# Patient Record
Sex: Male | Born: 2000 | Race: White | Hispanic: No | Marital: Single | Smoking: Never smoker
Health system: Southern US, Community
[De-identification: ages and names within clinical notes are randomized; demographics above are authoritative.]

## PROBLEM LIST (undated history)

## (undated) DIAGNOSIS — E109 Type 1 diabetes mellitus without complications: Secondary | ICD-10-CM

## (undated) DIAGNOSIS — E063 Autoimmune thyroiditis: Secondary | ICD-10-CM

## (undated) DIAGNOSIS — K900 Celiac disease: Secondary | ICD-10-CM

## (undated) HISTORY — DX: Autoimmune thyroiditis: E06.3

## (undated) HISTORY — PX: NO PAST SURGERIES: SHX2092

---
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
## 2019-04-07 ENCOUNTER — Other Ambulatory Visit (INDEPENDENT_AMBULATORY_CARE_PROVIDER_SITE_OTHER): Payer: Self-pay | Admitting: Pediatric Endocrinology

## 2019-04-07 DIAGNOSIS — E1065 Type 1 diabetes mellitus with hyperglycemia: Principal | ICD-10-CM

## 2019-04-07 DIAGNOSIS — IMO0001 Reserved for inherently not codable concepts without codable children: Secondary | ICD-10-CM

## 2019-04-24 ENCOUNTER — Other Ambulatory Visit (INDEPENDENT_AMBULATORY_CARE_PROVIDER_SITE_OTHER): Payer: Self-pay | Admitting: *Deleted

## 2019-04-24 ENCOUNTER — Telehealth (INDEPENDENT_AMBULATORY_CARE_PROVIDER_SITE_OTHER): Payer: Self-pay | Admitting: Pediatric Endocrinology

## 2019-04-24 NOTE — Telephone Encounter (Signed)
°  Who's calling (name and relationship to patient) : [name] (Mother)  Best contact number: [phone] Provider they see: [name]  Reason for call: Mom suspects that pt may have COVID. Mom stated that nurse at his former PCP advised that if pt's fever returns, if he becomes dizzy, or if he has blood in his stool, he should go to the emergency room. Mom wanted to know if [name] agreed with these recommendations given pt's present underlying conditions.

## 2019-04-24 NOTE — Telephone Encounter (Signed)
Returned call to mom.   Stephen Thompson was sleeping.   He has had symptoms for about a week. He was tested a few days ago in [facility] for Covid. He tested negative.   2 nights ago he had fever and some bright red blood in his stool. They returned to the testing center in [facility] where he had labs drawn.   Mom reports that his WBC was low and his platelet count was low (139 with nml >150).   He has been having intermittent fevers, trouble sleeping, aches, chills, and the 1 episode of rectal bleeding. He has not been having shortness of breath or significant respiratory symptoms.   She was concerned with his history of T1DM if she needed to take him the hospital? Discussed that he does need to stay on top of his sugar and make sure that he is taking insulin so that he does not go into ketosis as that would be a very bad combination with presumed Covid 19 infection. Discussed that Covid 19 can take 2-3 weeks and it is a very bumpy road. Advised her to keep in contact with his PCP. If she sees evidence of hemorrhage or bruising in his toes/fingers or increase in rectal bleeding or shortness of breath or feeling light headed she should take him to ER. Discussed that at 82 I cannot get him into the pediatric ED or admitted to the pediatric service.  Mom appreciated my return call and said that she will keep in touch with his PCP and keep [place] updated.   Lelon Huh, MD

## 2019-04-27 NOTE — Telephone Encounter (Signed)
Erroneous encounter

## 2019-05-27 ENCOUNTER — Encounter (INDEPENDENT_AMBULATORY_CARE_PROVIDER_SITE_OTHER): Payer: Self-pay | Admitting: Pediatric Endocrinology

## 2019-05-27 ENCOUNTER — Ambulatory Visit (INDEPENDENT_AMBULATORY_CARE_PROVIDER_SITE_OTHER): Payer: BC Managed Care – PPO | Admitting: Pediatric Endocrinology

## 2019-05-27 ENCOUNTER — Other Ambulatory Visit: Payer: Self-pay

## 2019-05-27 VITALS — BP 122/74 | HR 78 | Ht 67.6 in | Wt 183.6 lb

## 2019-05-27 DIAGNOSIS — E1065 Type 1 diabetes mellitus with hyperglycemia: Secondary | ICD-10-CM | POA: Diagnosis not present

## 2019-05-27 DIAGNOSIS — K900 Celiac disease: Secondary | ICD-10-CM | POA: Diagnosis not present

## 2019-05-27 DIAGNOSIS — IMO0001 Reserved for inherently not codable concepts without codable children: Secondary | ICD-10-CM

## 2019-05-27 DIAGNOSIS — Z4681 Encounter for fitting and adjustment of insulin pump: Secondary | ICD-10-CM | POA: Diagnosis not present

## 2019-05-27 LAB — POCT GLUCOSE (DEVICE FOR HOME USE): POC Glucose: 192 mg/dl — AB (ref 70–99)

## 2019-05-27 LAB — POCT GLYCOSYLATED HEMOGLOBIN (HGB A1C): Hemoglobin A1C: 9 % — AB (ref 4.0–5.6)

## 2019-05-27 NOTE — Progress Notes (Signed)
Subjective:  Subjective  Patient Name: Stephen Thompson Date of Birth: Mar 25, 2001  MRN: [id number]  Stephen Thompson  presents to the office today for follow-up evaluation and management of his type 1 diabetes, hypoglycemia, goiter, and celiac disease.    HISTORY OF PRESENT ILLNESS:   Stephen Thompson is a 18 y.o. Caucasian male   Stephen Thompson was accompanied by himself His mother came in at the start to ask her Covid related questions.   74. [name] was admitted to the PICU at [facility] on 10/31/11 for evaluation and management of new-onset T1DM, DKA, dehydration, and weight loss. After being successfully treated with iv insulin, he was transferred to the Pediatric Ward. He was transitioned to MDI with Novolog and Lantus. His most recent Lantus dose was 8 units. He was on the Novolog 150/50/30 two-component plan.  On 08/26/12 he was converted to a Medtronic Paradigm 723 insulin pump.     2. The patient's last [facility] visit was on 12/11/18. In the interim, he has been doing well.    He did have an episode of illness about 1 month ago associated with high fever and stomach issues. He tested negative for Covid. He returned to the testing center a day or 2 later and had blood work which showed a low white count and a low platelet count. He was presumed positive for Covid based on his symptoms and his blood counts. He felt better a few days later. Mom was asymptomatic but was quarantined for 3 weeks by her employer.   He feels that his sugar has been overall high. His sleep schedule is highly variable. He is eating about 2 meals per day and trying to stick to low carb snacks between meals.   He is usually bolusing for his food (there is a day with no food boluses on his printout). He often neglects to enter in his blood sugar in his pump. He is taking his insulin AFTER eating. He sees that his sugar spikes on his Dexcom.   He has been active recently painting a house for his GF's father. He is  learning coding and working with his Arts development officer.   He and his mom have mixed feelings about retuning to campus in the fall. He is at [employer] and is not doing the adjusted schedule of the other campuses which are starting earlier and ending by Thanksgiving.  They need to decide by July if he will be returning to campus this fall.   He did not enjoy having online classes this spring.   Celiac  He is eating gluten free all of the time. He is symptomatic if he accidentally gets gluten.     3. Pertinent Review of Systems:  Constitutional: The patient feels "tired". The patient seems healthy and active. Eyes: Vision seems to be good. There are no recognized eye problems. Eye exam in March 2016. Due 2021 Feels that his vision is worse.  Neck: The patient has no complaints of anterior neck swelling, soreness, tenderness, pressure, discomfort, or difficulty swallowing.   Heart: Heart rate increases with exercise or other physical activity. The patient has no complaints of palpitations, irregular heart beats, chest pain, or chest pressure.   Lungs: no asthma or wheezing.  Gastrointestinal: Bowel movents seem normal. The patient has no complaints of excessive hunger, acid reflux, upset stomach, stomach aches or pains, diarrhea, or constipation.  Legs: Muscle mass and strength seem normal. There are no complaints of numbness, tingling, burning, or pain. No edema is  noted.  Feet: There are no obvious foot problems. There are no complaints of numbness, tingling, burning, or pain. No edema is noted. Neurologic: There are no recognized problems with muscle movement and strength, sensation, or coordination. GYN/GU: normal   Diabetes ID: MY ID bracelet.    Annual labs last done July 2019- no issues- will do today  Pump download: avg glucose 310 +/- 81. 45% basal. 124 grams of carb per day. 99% above target.   Last visit: Testing 3.6 times per day + Dexcom Avg BG 293 +/- 89. 89% above target. 45% basal. 146  grams of carb per day.              CGM/Dexcom avg SG 222 +/- 66. 73% above target, 26% in target, 1% below target. In target mostly when sleeping.   Last visit:  avg SG 232 +/- 88. 66% above target. 34% in target. Rare hypoglycemia. Pattern of night time and late afternoon hyperglycemia. Change in schedule with college.   PAST MEDICAL, FAMILY, AND SOCIAL HISTORY  Past Medical History:  Diagnosis Date  . Celiac disease 02/24/2012   Recently diagnosed  . Diabetes mellitus   . Type 1 diabetes mellitus not at goal [facility]) 10/31/2011    Family History  Problem Relation Age of Onset  . Diabetes Paternal Grandfather        type 2  . Hyperthyroidism Maternal Grandmother   . Thyroid disease Maternal Grandmother   . Multiple sclerosis Maternal Grandfather   . Diabetes Maternal Grandfather        type 2  . Heart failure Cousin   . Celiac disease Neg Hx      Current Outpatient Medications:  .  ACCU-CHEK FASTCLIX LANCETS MISC, 1 Container by Does not apply route See admin instructions. Check blood sugar 10 times daily Dispense 300 lancets per month., Disp: 300 each, Rfl: 6 .  Continuous Blood Gluc Sensor (DEXCOM G6 SENSOR) MISC, 1 each by Does not apply route as directed. 1 sensor every 10 days, Disp: 3 each, Rfl: 5 .  Continuous Blood Gluc Transmit (DEXCOM G6 TRANSMITTER) MISC, 1 each by Does not apply route every 3 (three) months., Disp: 1 each, Rfl: 1 .  glucagon (GLUCAGEN HYPOKIT) 1 MG SOLR injection, GLUCAGEN HYPOKIT DOUBLE PACK. Inject 1 mg into anterior thigh muscle if unconscious, unable to swallow, unresponsive and/or has a seizure, Disp: 2 each, Rfl: 1 .  insulin aspart (NOVOLOG) 100 UNIT/ML injection, USE 300 UNITS IN INSULIN PUMP EVERY 48 HOURS AS DIRECTED, Disp: 40 mL, Rfl: 1 .  Glucagon (BAQSIMI TWO PACK) 3 MG/DOSE POWD, Place 1 each into the nose as needed (severe hypoglycemia with unresponsiveness). (Patient not taking: Reported on 05/27/2019), Disp: 1 each, Rfl: 3 .  glucose  blood (ACCU-CHEK GUIDE) test strip, Use as instructed for 6 checks per day plus per protocol for hyper/hypoglycemia (Patient not taking: Reported on 05/27/2019), Disp: 200 each, Rfl: 3 .  insulin aspart (NOVOLOG FLEXPEN) 100 UNIT/ML FlexPen, Inject into skin if insulin pump fails, per diabetes plan (Patient not taking: Reported on 12/25/2016), Disp: 5 mL, Rfl: 3 .  insulin glargine (LANTUS) 100 UNIT/ML injection, Use as directed by physician if insulin pump fails. (Patient not taking: Reported on 12/25/2016), Disp: 5 mL, Rfl: 3 .  Insulin Pen Needle 32G X 4 MM MISC, Inject 1 each into the skin as needed. Reported on 05/08/2016, Disp: , Rfl:   Allergies as of 05/27/2019 - Review Complete 05/27/2019  Allergen Reaction Noted  .  Wheat  12/20/2011     reports that he is a non-smoker but has been exposed to tobacco smoke. He has never used smokeless tobacco. He reports that he does not drink alcohol or use drugs. Pediatric History  Patient Parents  . Dromgoole,Alexandra (Mother)   Other Topics Concern  . Not on file  Social History Narrative   Mom has primary custody. 8th grade. Soccer  Parents have joint custody per mother 12/20/2011. Dad in rehab as of fall 2014    Primary Care Provider: Orpha Bur, DO   Rising Sophomore at [employer]: There are no other significant problems involving Marilyn's other body systems.    Objective:  Objective   Vital Signs:  BP 122/74   Pulse 78   Ht 5' 7.6" (1.717 m)   Wt 183 lb 9.6 oz (83.3 kg)   BMI 28.25 kg/m    Ht Readings from Last 3 Encounters:  05/27/19 5' 7.6" (1.717 m) (24 %, Z= -0.70)*  12/11/18 5' 8"  (1.727 m) (30 %, Z= -0.54)*  07/07/18 5' 7.91" (1.725 m) (29 %, Z= -0.54)*   * Growth percentiles are based on CDC (Boys, 2-20 Years) data.   Wt Readings from Last 3 Encounters:  05/27/19 183 lb 9.6 oz (83.3 kg) (85 %, Z= 1.02)*  12/11/18 165 lb 8 oz (75.1 kg) (70 %, Z= 0.51)*  07/07/18 167 lb 6.4 oz (75.9 kg) (74 %, Z= 0.64)*    * Growth percentiles are based on CDC (Boys, 2-20 Years) data.   HC Readings from Last 3 Encounters:  No data found for [facility]   Body surface area is 1.99 meters squared. 24 %ile (Z= -0.70) based on CDC (Boys, 2-20 Years) Stature-for-age data based on Stature recorded on 05/27/2019. 85 %ile (Z= 1.02) based on CDC (Boys, 2-20 Years) weight-for-age data using vitals from 05/27/2019.    PHYSICAL EXAM:   Constitutional: The patient appears active and engaged.  The patient's height and weight are normal for age. He has had some weight gain Head: The head is normocephalic. Face: The face appears normal. There are no obvious dysmorphic features. Eyes: The eyes appear to be normally formed and spaced. Gaze is conjugate. There is no obvious arcus or proptosis. Moisture appears normal.  Ears: The ears are normally placed and appear externally normal. Mouth: The oropharynx and tongue appear normal. Dentition appears to be normal for age. Oral moisture is normal.  Neck: The neck appears to be visibly normal. The thyroid gland is 14 grams in size. The consistency of the thyroid gland is normal. The thyroid gland is not tender to palpation. Lungs: The lungs are clear to auscultation. Air movement is good. Heart: Heart rate and rhythm are regular. Heart sounds S1 and S2 are normal. I did not appreciate any pathologic cardiac murmurs. Abdomen: The abdomen appears to be normal in size for the patient's age. Bowel sounds are normal. There is no obvious hepatomegaly, splenomegaly, or other mass effect. Arms: Muscle size and bulk are normal for age. Hands: There is no obvious tremor. Phalangeal and metacarpophalangeal joints are normal. Palmar muscles are normal for age. Palmar skin is normal. Palmar moisture is also normal. Legs: Muscles appear normal for age. No edema is present. Feet: Feet are normally formed. Dorsalis pedal pulses are normal. Neurologic: Strength is normal for age in both the upper and lower  extremities. Muscle tone is normal. Sensation to touch is normal in both the legs and feet.     LAB DATA:  Results for orders placed or performed in visit on 05/27/19 (from the past 672 hour(s))  Comprehensive metabolic panel   Collection Time: 05/27/19 12:00 AM  Result Value Ref Range   Glucose, Bld 120 (H) 65 - 99 mg/dL   BUN 14 7 - 20 mg/dL   Creat 0.80 0.60 - 1.26 mg/dL   BUN/Creatinine Ratio NOT APPLICABLE 6 - 22 (calc)   Sodium 139 135 - 146 mmol/L   Potassium 3.7 (L) 3.8 - 5.1 mmol/L   Chloride 102 98 - 110 mmol/L   CO2 26 20 - 32 mmol/L   Calcium 9.6 8.9 - 10.4 mg/dL   Total Protein 7.1 6.3 - 8.2 g/dL   Albumin 4.7 3.6 - 5.1 g/dL   Globulin 2.4 2.1 - 3.5 g/dL (calc)   AG Ratio 2.0 1.0 - 2.5 (calc)   Total Bilirubin 0.4 0.2 - 1.1 mg/dL   Alkaline phosphatase (APISO) 107 46 - 169 U/L   AST 26 12 - 32 U/L   ALT 24 8 - 46 U/L  CBC with Differential/Platelet   Collection Time: 05/27/19 12:00 AM  Result Value Ref Range   WBC 6.1 3.8 - 10.8 Thousand/uL   RBC 5.36 4.20 - 5.80 Million/uL   Hemoglobin 15.0 13.2 - 17.1 g/dL   HCT 45.6 38.5 - 50.0 %   MCV 85.1 80.0 - 100.0 fL   MCH 28.0 27.0 - 33.0 pg   MCHC 32.9 32.0 - 36.0 g/dL   RDW 12.7 11.0 - 15.0 %   Platelets 257 140 - 400 Thousand/uL   MPV 9.9 7.5 - 12.5 fL   Neutro Abs 3,166 1,500 - 7,800 cells/uL   Lymphs Abs 2,257 850 - 3,900 cells/uL   Absolute Monocytes 543 200 - 950 cells/uL   Eosinophils Absolute 104 15 - 500 cells/uL   Basophils Absolute 31 0 - 200 cells/uL   Neutrophils Relative % 51.9 %   Total Lymphocyte 37.0 %   Monocytes Relative 8.9 %   Eosinophils Relative 1.7 %   Basophils Relative 0.5 %  TSH   Collection Time: 05/27/19 12:00 AM  Result Value Ref Range   TSH 5.21 (H) 0.50 - 4.30 mIU/L  T4, free   Collection Time: 05/27/19 12:00 AM  Result Value Ref Range   Free T4 1.2 0.8 - 1.4 ng/dL  Lipid panel   Collection Time: 05/27/19 12:00 AM  Result Value Ref Range   Cholesterol 191 (H) <170  mg/dL   HDL 50 >45 mg/dL   Triglycerides 107 (H) <90 mg/dL   LDL Cholesterol (Calc) 120 (H) <110 mg/dL (calc)   Total CHOL/HDL Ratio 3.8 <5.0 (calc)   Non-HDL Cholesterol (Calc) 141 (H) <120 mg/dL (calc)  Microalbumin / creatinine urine ratio   Collection Time: 05/27/19 12:00 AM  Result Value Ref Range   Creatinine, Urine 104 20 - 320 mg/dL   Microalb, Ur 0.5 mg/dL   Microalb Creat Ratio 5 <30 mcg/mg creat  POCT Glucose (Device for Home Use)   Collection Time: 05/27/19 10:56 AM  Result Value Ref Range   Glucose Fasting, POC     POC Glucose 192 (A) 70 - 99 mg/dl  POCT glycosylated hemoglobin (Hb A1C)   Collection Time: 05/27/19 11:01 AM  Result Value Ref Range   Hemoglobin A1C 9.0 (A) 4.0 - 5.6 %   HbA1c POC (<> result, manual entry)     HbA1c, POC (prediabetic range)     HbA1c, POC (controlled diabetic range)     Last A1C 8.9% 12/11/18  Last A1C 9.6% on 07/07/18   Assessment and Plan:  Assessment  ASSESSMENT:   Viral is a 18 y.o. Caucasian male with type 1 diabetes since age 52.    Type 1 diabetes, uncontrolled - blood sugars are overall comparable to last visit.  - Now wearing CGM most of the time - Wearing diabetes ID - A1C as above (relatively stable but still above ADA recommended <7%) - POC glucose as above - Wearing Medtronic Paradigm pump which is out of warranty. Has not been able to afford a new pump. Interested in Tandem. Not interested in OmniPod as he does not like how it "sticks out".  - Adjustments made to insulin pump settings as follows: Basal MN 1.4 -> 1.5 4 1.5 -> 1.6 8 1.3 -> 1.4 2p 1.6 -> 1.7 6p 1.6 -> 1.7  35.4 -> 37.82  Carb ratio MN  5  Target MN 130 6 120 10p 130  Sensitivity 20  Celiac - eating gluten free most of the time - has started to notice more when he eats gluten accidentally.    Follow-up: Return in about 3 months (around 08/27/2019).       Lelon Huh, MD  Level of Service: This visit lasted in excess of 25  minutes. More than 50% of the visit was devoted to counseling.  When a patient is on insulin, intensive monitoring of blood glucose levels is necessary to avoid hyperglycemia and hypoglycemia. Severe hyperglycemia/hypoglycemia can lead to hospital admissions and be life threatening.

## 2019-05-27 NOTE — Patient Instructions (Signed)
Basal MN 1.4 -> 1.5 4 1.5 -> 1.6 8 1.3 -> 1.4 2p 1.6 -> 1.7 6p 1.6 -> 1.7  35.4 -> 37.8  Carb ratio MN  5  Target MN 130 6 120 10p 130  Sensitivity 20

## 2019-05-28 LAB — COMPREHENSIVE METABOLIC PANEL
AG Ratio: 2 (calc) (ref 1.0–2.5)
ALT: 24 U/L (ref 8–46)
AST: 26 U/L (ref 12–32)
Albumin: 4.7 g/dL (ref 3.6–5.1)
Alkaline phosphatase (APISO): 107 U/L (ref 46–169)
BUN: 14 mg/dL (ref 7–20)
CO2: 26 mmol/L (ref 20–32)
Calcium: 9.6 mg/dL (ref 8.9–10.4)
Chloride: 102 mmol/L (ref 98–110)
Creat: 0.8 mg/dL (ref 0.60–1.26)
Globulin: 2.4 g/dL (calc) (ref 2.1–3.5)
Glucose, Bld: 120 mg/dL — ABNORMAL HIGH (ref 65–99)
Potassium: 3.7 mmol/L — ABNORMAL LOW (ref 3.8–5.1)
Sodium: 139 mmol/L (ref 135–146)
Total Bilirubin: 0.4 mg/dL (ref 0.2–1.1)
Total Protein: 7.1 g/dL (ref 6.3–8.2)

## 2019-05-28 LAB — CBC WITH DIFFERENTIAL/PLATELET
Absolute Monocytes: 543 cells/uL (ref 200–950)
Basophils Absolute: 31 cells/uL (ref 0–200)
Basophils Relative: 0.5 %
Eosinophils Absolute: 104 cells/uL (ref 15–500)
Eosinophils Relative: 1.7 %
HCT: 45.6 % (ref 38.5–50.0)
Hemoglobin: 15 g/dL (ref 13.2–17.1)
Lymphs Abs: 2257 cells/uL (ref 850–3900)
MCH: 28 pg (ref 27.0–33.0)
MCHC: 32.9 g/dL (ref 32.0–36.0)
MCV: 85.1 fL (ref 80.0–100.0)
MPV: 9.9 fL (ref 7.5–12.5)
Monocytes Relative: 8.9 %
Neutro Abs: 3166 cells/uL (ref 1500–7800)
Neutrophils Relative %: 51.9 %
Platelets: 257 10*3/uL (ref 140–400)
RBC: 5.36 10*6/uL (ref 4.20–5.80)
RDW: 12.7 % (ref 11.0–15.0)
Total Lymphocyte: 37 %
WBC: 6.1 10*3/uL (ref 3.8–10.8)

## 2019-05-28 LAB — MICROALBUMIN / CREATININE URINE RATIO
Creatinine, Urine: 104 mg/dL (ref 20–320)
Microalb Creat Ratio: 5 mcg/mg creat (ref <30)
Microalb, Ur: 0.5 mg/dL

## 2019-05-28 LAB — LIPID PANEL
Cholesterol: 191 mg/dL — ABNORMAL HIGH (ref <170)
HDL: 50 mg/dL (ref >45)
LDL Cholesterol (Calc): 120 mg/dL (calc) — ABNORMAL HIGH (ref <110)
Non-HDL Cholesterol (Calc): 141 mg/dL (calc) — ABNORMAL HIGH (ref <120)
Total CHOL/HDL Ratio: 3.8 (calc) (ref <5.0)
Triglycerides: 107 mg/dL — ABNORMAL HIGH (ref <90)

## 2019-05-28 LAB — TSH: TSH: 5.21 mIU/L — ABNORMAL HIGH (ref 0.50–4.30)

## 2019-05-28 LAB — T4, FREE: Free T4: 1.2 ng/dL (ref 0.8–1.4)

## 2019-06-11 ENCOUNTER — Encounter (INDEPENDENT_AMBULATORY_CARE_PROVIDER_SITE_OTHER): Payer: Self-pay

## 2019-06-15 ENCOUNTER — Other Ambulatory Visit (INDEPENDENT_AMBULATORY_CARE_PROVIDER_SITE_OTHER): Payer: Self-pay | Admitting: Pediatric Endocrinology

## 2019-06-15 MED ORDER — LEVOTHYROXINE SODIUM 25 MCG PO TABS: 25.0000 ug | ORAL_TABLET | Freq: Every day | ORAL | 6 refills | Status: DC

## 2019-06-16 ENCOUNTER — Other Ambulatory Visit (INDEPENDENT_AMBULATORY_CARE_PROVIDER_SITE_OTHER): Payer: Self-pay | Admitting: Pediatric Endocrinology

## 2019-06-16 DIAGNOSIS — IMO0001 Reserved for inherently not codable concepts without codable children: Secondary | ICD-10-CM

## 2019-07-08 ENCOUNTER — Telehealth (INDEPENDENT_AMBULATORY_CARE_PROVIDER_SITE_OTHER): Payer: Self-pay | Admitting: Pediatric Endocrinology

## 2019-07-08 NOTE — Telephone Encounter (Signed)
°  Who's calling (name and relationship to patient) : Sheppard Coil (dad) Best contact number: [phone] Provider they see: Baldo Ash Reason for call: Mom called about patient going to school.  [employer].  Mom would like the feedback of what to do with the patient in the that environment.  Is it safe for him to be in school.   Please call.     PRESCRIPTION REFILL ONLY  Name of prescription:  Pharmacy:

## 2019-07-08 NOTE — Telephone Encounter (Signed)
Returned call to parent  Discussed pros and cons of [employer] returning to campus this fall.   Lelon Huh, MD

## 2019-07-08 NOTE — Telephone Encounter (Signed)
Routed to [name]

## 2019-09-02 ENCOUNTER — Other Ambulatory Visit: Payer: Self-pay | Admitting: Pediatric Endocrinology

## 2019-09-02 DIAGNOSIS — IMO0001 Reserved for inherently not codable concepts without codable children: Secondary | ICD-10-CM

## 2019-10-02 ENCOUNTER — Encounter (INDEPENDENT_AMBULATORY_CARE_PROVIDER_SITE_OTHER): Payer: Self-pay

## 2019-10-07 ENCOUNTER — Other Ambulatory Visit (INDEPENDENT_AMBULATORY_CARE_PROVIDER_SITE_OTHER): Payer: Self-pay | Admitting: Pediatric Endocrinology

## 2019-10-07 ENCOUNTER — Telehealth (INDEPENDENT_AMBULATORY_CARE_PROVIDER_SITE_OTHER): Payer: Self-pay | Admitting: Pediatric Endocrinology

## 2019-10-07 DIAGNOSIS — R509 Fever, unspecified: Secondary | ICD-10-CM

## 2019-10-07 NOTE — Telephone Encounter (Signed)
Spoke to mom, Stephen Thompson got his flu shot 1.5 weeks ago and began having off and on fevers multiple times daily since. He had a teleconference with an [place] PA who is going to do a Covid test and bloodwork. She wanted to talk to [name] just to see if she thinks this is a good plan. I told her I would relay this info to [name] and have her call mom.  If you would call the number in the phone note is wrong. Its [phone]

## 2019-10-07 NOTE — Telephone Encounter (Signed)
Returned call to [employer] and mom  He got his flu vax at [employer] about 10 days ago.   Since then he has been having on and off fevers up to 101.5.  He has been having migratory pain (neck, shoulder, back) lasting anywhere from a few hour (high intensity) to a day and a half (lower intensity).  He has had some pharyngitis/ throat itching. He felt like strep for about 1/2 a day- which then turned into neck pain. (mon-tue). About 1 week into course  No headaches, no change in vision, no change in taste. No dysphagia other than Monday into Tuesday. No trouble trouble breathing/wheezing. He did have a little tickly cough on Monday. No stomach, diarrhea or constipation. No vomiting or reflux. No dysuria. No persistent back pain. No leg pain. No sores or rash on his feet. No known exposures.

## 2019-10-07 NOTE — Progress Notes (Signed)
Labs ordered at [facility] with peds ED who said that if he was unable to have labs drawn at [facility] in the morning he could come to the ED for blood work +/- Covid testing.   Stephen Huh, MD

## 2019-10-07 NOTE — Progress Notes (Signed)
error 

## 2019-10-07 NOTE — Telephone Encounter (Signed)
°  Who's calling (name and relationship to patient) : Junius Finner (mom)  Best contact number: [phone]  Provider they see: Baldo Ash   Reason for call: Mom LVM that patient is between pcp and he is having some health issues. She would like for Dr Baldo Ash to call her to discuss some of his issues.  Please call.     PRESCRIPTION REFILL ONLY  Name of prescription:  Pharmacy:

## 2019-10-09 LAB — COMPREHENSIVE METABOLIC PANEL
AG Ratio: 1.6 (calc) (ref 1.0–2.5)
ALT: 140 U/L — ABNORMAL HIGH (ref 8–46)
AST: 135 U/L — ABNORMAL HIGH (ref 12–32)
Albumin: 4.1 g/dL (ref 3.6–5.1)
Alkaline phosphatase (APISO): 156 U/L (ref 46–169)
BUN: 15 mg/dL (ref 7–20)
CO2: 25 mmol/L (ref 20–32)
Calcium: 9.1 mg/dL (ref 8.9–10.4)
Chloride: 102 mmol/L (ref 98–110)
Creat: 0.82 mg/dL (ref 0.60–1.26)
Globulin: 2.6 g/dL (calc) (ref 2.1–3.5)
Glucose, Bld: 213 mg/dL — ABNORMAL HIGH (ref 65–99)
Potassium: 4.4 mmol/L (ref 3.8–5.1)
Sodium: 136 mmol/L (ref 135–146)
Total Bilirubin: 0.7 mg/dL (ref 0.2–1.1)
Total Protein: 6.7 g/dL (ref 6.3–8.2)

## 2019-10-09 LAB — CBC WITH DIFFERENTIAL/PLATELET
Absolute Monocytes: 790 cells/uL (ref 200–950)
Basophils Absolute: 42 cells/uL (ref 0–200)
Basophils Relative: 0.3 %
Eosinophils Absolute: 42 cells/uL (ref 15–500)
Eosinophils Relative: 0.3 %
HCT: 40.4 % (ref 38.5–50.0)
Hemoglobin: 13.5 g/dL (ref 13.2–17.1)
Lymphs Abs: 11393 cells/uL — ABNORMAL HIGH (ref 850–3900)
MCH: 28.1 pg (ref 27.0–33.0)
MCHC: 33.4 g/dL (ref 32.0–36.0)
MCV: 84 fL (ref 80.0–100.0)
MPV: 10.6 fL (ref 7.5–12.5)
Monocytes Relative: 5.6 %
Neutro Abs: 1833 cells/uL (ref 1500–7800)
Neutrophils Relative %: 13 %
Platelets: 197 10*3/uL (ref 140–400)
RBC: 4.81 10*6/uL (ref 4.20–5.80)
RDW: 12.2 % (ref 11.0–15.0)
Total Lymphocyte: 80.8 %
WBC: 14.1 10*3/uL — ABNORMAL HIGH (ref 3.8–10.8)

## 2019-10-09 LAB — TEST AUTHORIZATION

## 2019-10-09 LAB — SAR COV2 SEROLOGY (COVID19)AB(IGG),IA: SARS CoV2 AB IGG: NEGATIVE

## 2019-10-09 LAB — HETEROPHILE,MONO SCREEN(REFL): Heterophile,Mono Screen(REFL): POSITIVE — AB

## 2019-10-09 LAB — REFLEX TIQ

## 2019-12-23 ENCOUNTER — Telehealth (INDEPENDENT_AMBULATORY_CARE_PROVIDER_SITE_OTHER): Payer: Self-pay | Admitting: Pediatric Endocrinology

## 2019-12-23 NOTE — Telephone Encounter (Signed)
  Who's calling (name and relationship to patient) : [place] - mom   Best contact number: [phone]  Provider they see: Dr Baldo Ash   Reason for call:  Mom called to advise Dr Baldo Ash that 12/20/19 at 4:00AM Stephen Thompson woke his mom up due to some concerns with chest pain. He states he felt like his heart was flopping in his chest and felt like he was out of breath. An ambulance was called but they said he was fine. She would like to speak with Dr Baldo Ash about this before their next follow up   [place]  Name of prescription:  Pharmacy:

## 2019-12-23 NOTE — Telephone Encounter (Signed)
Left voice mail to call back 

## 2019-12-23 NOTE — Telephone Encounter (Signed)
  Who's calling (name and relationship to patient) : [place] - Mom   Best contact number: [phone]   Provider they see: Dr Baldo Ash   Reason for call: Mom called to advise that [name] had a    [place]  Name of prescription:  Pharmacy:

## 2020-01-13 ENCOUNTER — Encounter (INDEPENDENT_AMBULATORY_CARE_PROVIDER_SITE_OTHER): Payer: Self-pay | Admitting: Pediatric Endocrinology

## 2020-01-13 ENCOUNTER — Ambulatory Visit (INDEPENDENT_AMBULATORY_CARE_PROVIDER_SITE_OTHER): Payer: BC Managed Care – PPO | Admitting: Pediatric Endocrinology

## 2020-01-13 ENCOUNTER — Telehealth (INDEPENDENT_AMBULATORY_CARE_PROVIDER_SITE_OTHER): Payer: Self-pay | Admitting: Pediatric Endocrinology

## 2020-01-13 ENCOUNTER — Other Ambulatory Visit: Payer: Self-pay

## 2020-01-13 VITALS — BP 116/78 | HR 88 | Ht 67.91 in | Wt 206.8 lb

## 2020-01-13 DIAGNOSIS — E109 Type 1 diabetes mellitus without complications: Secondary | ICD-10-CM | POA: Diagnosis not present

## 2020-01-13 DIAGNOSIS — E10649 Type 1 diabetes mellitus with hypoglycemia without coma: Secondary | ICD-10-CM

## 2020-01-13 DIAGNOSIS — Z4681 Encounter for fitting and adjustment of insulin pump: Secondary | ICD-10-CM

## 2020-01-13 DIAGNOSIS — E1065 Type 1 diabetes mellitus with hyperglycemia: Secondary | ICD-10-CM

## 2020-01-13 LAB — POCT GLYCOSYLATED HEMOGLOBIN (HGB A1C): Hemoglobin A1C: 8.2 % — AB (ref 4.0–5.6)

## 2020-01-13 LAB — POCT GLUCOSE (DEVICE FOR HOME USE): POC Glucose: 160 mg/dl — AB (ref 70–99)

## 2020-01-13 MED ORDER — DEXCOM G6 SENSOR MISC: 1.0000 | 3 refills | Status: DC

## 2020-01-13 MED ORDER — GLUCAGEN HYPOKIT 1 MG IJ SOLR: INTRAMUSCULAR | 1 refills | Status: AC

## 2020-01-13 NOTE — Progress Notes (Signed)
Subjective:  Subjective  Patient Name: Stephen Thompson Date of Birth: 2001/12/05  MRN: [id number]  Stephen Thompson  presents to the office today for follow-up evaluation and management of his type 1 diabetes, hypoglycemia, goiter, and celiac disease.    HISTORY OF PRESENT ILLNESS:   Stephen Thompson is a 19 y.o. Caucasian male   Stephen Thompson was accompanied by himself His mother came in at the start to ask her Covid related questions.   76. Stephen Thompson was admitted to the PICU at [facility] on 10/31/11 for evaluation and management of new-onset T1DM, DKA, dehydration, and weight loss. After being successfully treated with iv insulin, he was transferred to the Pediatric Ward. He was transitioned to MDI with Novolog and Lantus. His most recent Lantus dose was 8 units. He was on the Novolog 150/50/30 two-component plan.  On 08/26/12 he was converted to a Medtronic Paradigm 723 insulin pump.     2. The patient's last [facility] visit was on 05/26/29. In the interim, he has been doing well.    He had mononucleosis last summer which we picked up on blood testing when he was negative for covid at the walk in clinic. He was having intermittent fevers and then felt better.   Blood sugars have been "better" but still wonky. He says that sometimes his sugar will rise unexpectedly and other times he will be low and need to eat a ton of carbs to stabilize his sugar. He feels that the higher sugars tend to correspond with day 3 of a set or a bad site.   He is interested in upgrading his pump. He thinks that he is out of warranty with his Medtronic 530.   He is working on bolusing before eating but doesn't always know what he is going to eat.   He is doing remote college virtually.   Celiac  He is eating gluten free all of the time. He is symptomatic if he accidentally gets gluten.   Had an episode of tachycardia at home at rest. [facility] and they said that it was anxiety. He isn't sure. He has a pulse ox at  home. Max heart rate was 140 bpm. No decrease in oxygenation. Some pain in his arms during episode. 12/13/19.   3. Pertinent Review of Systems:  Constitutional: The patient feels "good". The patient seems healthy and active. Eyes: Vision seems to be good. There are no recognized eye problems. Eye exam in March 2016. Due 2021 Feels that his vision is getting worse.  Neck: The patient has no complaints of anterior neck swelling, soreness, tenderness, pressure, discomfort, or difficulty swallowing.   Heart: Heart rate increases with exercise or other physical activity. The patient has no complaints of palpitations, irregular heart beats, chest pain, or chest pressure.   Lungs: no asthma or wheezing.  Gastrointestinal: Bowel movents seem normal. The patient has no complaints of excessive hunger, acid reflux, upset stomach, stomach aches or pains, diarrhea, or constipation.  Legs: Muscle mass and strength seem normal. There are no complaints of numbness, tingling, burning, or pain. No edema is noted.  Feet: There are no obvious foot problems. There are no complaints of numbness, tingling, burning, or pain. No edema is noted. Neurologic: There are no recognized problems with muscle movement and strength, sensation, or coordination. GYN/GU: normal   Diabetes ID: MY ID bracelet.    Annual labs last done June 2020  Pump download: avg BG 258 +/- 74. 87% above target. 38% basal. 209 +/- 77  grams of carb per day.   Last visit: avg glucose 310 +/- 81. 45% basal. 124 grams of carb per day. 99% above target.            CGM/Dexcom  avg SG 222 +/- 66. 73% above target, 26% in target, 1% below target. In target mostly when sleeping.   PAST MEDICAL, FAMILY, AND SOCIAL HISTORY  Past Medical History:  Diagnosis Date  . Celiac disease 02/24/2012   Recently diagnosed  . Diabetes mellitus   . Type 1 diabetes mellitus not at goal [facility]) 10/31/2011    Family History  Problem Relation Age of Onset  .  Diabetes Paternal Grandfather        type 2  . Hyperthyroidism Maternal Grandmother   . Thyroid disease Maternal Grandmother   . Multiple sclerosis Maternal Grandfather   . Diabetes Maternal Grandfather        type 2  . Heart failure Cousin   . Celiac disease Neg Hx      Current Outpatient Medications:  .  ACCU-CHEK FASTCLIX LANCETS MISC, 1 Container by Does not apply route See admin instructions. Check blood sugar 10 times daily Dispense 300 lancets per month., Disp: 300 each, Rfl: 6 .  Continuous Blood Gluc Sensor (DEXCOM G6 SENSOR) MISC, 1 each by Does not apply route as directed. 1 sensor every 7-10 days, Disp: 12 each, Rfl: 3 .  Continuous Blood Gluc Transmit (DEXCOM G6 TRANSMITTER) MISC, 1 each by Does not apply route every 3 (three) months., Disp: 1 each, Rfl: 1 .  glucagon (GLUCAGEN HYPOKIT) 1 MG SOLR injection, GLUCAGEN HYPOKIT DOUBLE PACK. Inject 1 mg into anterior thigh muscle if unconscious, unable to swallow, unresponsive and/or has a seizure, Disp: 2 each, Rfl: 1 .  insulin aspart (NOVOLOG) 100 UNIT/ML injection, USE 300 UNITS IN INSULIN PUMP EVERY 48 HOURS AS DIRECTED, Disp: 40 mL, Rfl: 1 .  Insulin Pen Needle 32G X 4 MM MISC, Inject 1 each into the skin as needed. Reported on 05/08/2016, Disp: , Rfl:  .  levothyroxine (SYNTHROID) 25 MCG tablet, Take 1 tablet (25 mcg total) by mouth daily before breakfast., Disp: 30 tablet, Rfl: 6 .  chlorhexidine (PERIDEX) 0.12 % solution, , Disp: , Rfl:  .  Glucagon (BAQSIMI TWO PACK) 3 MG/DOSE POWD, Place 1 each into the nose as needed (severe hypoglycemia with unresponsiveness). (Patient not taking: Reported on 05/27/2019), Disp: 1 each, Rfl: 3 .  glucose blood (ACCU-CHEK GUIDE) test strip, Use as instructed for 6 checks per day plus per protocol for hyper/hypoglycemia (Patient not taking: Reported on 05/27/2019), Disp: 200 each, Rfl: 3 .  insulin aspart (NOVOLOG FLEXPEN) 100 UNIT/ML FlexPen, Inject into skin if insulin pump fails, per diabetes  plan (Patient not taking: Reported on 12/25/2016), Disp: 5 mL, Rfl: 3 .  insulin glargine (LANTUS) 100 UNIT/ML injection, Use as directed by physician if insulin pump fails. (Patient not taking: Reported on 12/25/2016), Disp: 5 mL, Rfl: 3  Allergies as of 01/13/2020 - Review Complete 01/13/2020  Allergen Reaction Noted  . Wheat  12/20/2011     reports that he has never smoked. He has never used smokeless tobacco. He reports that he does not drink alcohol or use drugs. Pediatric History  Patient Parents  . Allemand,Alexandra (Mother)   Other Topics Concern  . Not on file  Social History Narrative   Mom has primary custody. 8th grade. Soccer  Parents have joint custody per mother 12/20/2011. Dad in rehab as of fall 2014  Primary Care Provider: Orpha Bur, DO   Rising Sophomore at [employer]: There are no other significant problems involving Evie's other body systems.    Objective:  Objective   Vital Signs:  BP 116/78   Pulse 88   Ht 5' 7.91" (1.725 m)   Wt 206 lb 12.8 oz (93.8 kg)   BMI 31.52 kg/m    Ht Readings from Last 3 Encounters:  01/13/20 5' 7.91" (1.725 m) (27 %, Z= -0.61)*  05/27/19 5' 7.6" (1.717 m) (24 %, Z= -0.70)*  12/11/18 5' 8"  (1.727 m) (30 %, Z= -0.54)*   * Growth percentiles are based on CDC (Boys, 2-20 Years) data.   Wt Readings from Last 3 Encounters:  01/13/20 206 lb 12.8 oz (93.8 kg) (94 %, Z= 1.55)*  05/27/19 183 lb 9.6 oz (83.3 kg) (85 %, Z= 1.02)*  12/11/18 165 lb 8 oz (75.1 kg) (70 %, Z= 0.51)*   * Growth percentiles are based on CDC (Boys, 2-20 Years) data.   HC Readings from Last 3 Encounters:  No data found for [facility]   Body surface area is 2.12 meters squared. 27 %ile (Z= -0.61) based on CDC (Boys, 2-20 Years) Stature-for-age data based on Stature recorded on 01/13/2020. 94 %ile (Z= 1.55) based on CDC (Boys, 2-20 Years) weight-for-age data using vitals from 01/13/2020.    PHYSICAL EXAM:  Constitutional: The patient  appears active and engaged.  The patient's height and weight are normal for age. He has had some weight gain Head: The head is normocephalic. Face: The face appears normal. There are no obvious dysmorphic features. Eyes: The eyes appear to be normally formed and spaced. Gaze is conjugate. There is no obvious arcus or proptosis. Moisture appears normal.  Ears: The ears are normally placed and appear externally normal. Mouth: The oropharynx and tongue appear normal. Dentition appears to be normal for age. Oral moisture is normal.  Neck: The neck appears to be visibly normal. The thyroid gland is 14 grams in size. The consistency of the thyroid gland is normal. The thyroid gland is not tender to palpation. Lungs: No increased work of breathing Heart: normal pulses and peripheral perfusion Abdomen: The abdomen appears to be normal in size for the patient's age. There is no obvious hepatomegaly, splenomegaly, or other mass effect. Arms: Muscle size and bulk are normal for age. Hands: There is no obvious tremor. Phalangeal and metacarpophalangeal joints are normal. Palmar muscles are normal for age. Palmar skin is normal. Palmar moisture is also normal. Legs: Muscles appear normal for age. No edema is present. Feet: Feet are normally formed. Dorsalis pedal pulses are normal. Neurologic: Strength is normal for age in both the upper and lower extremities. Muscle tone is normal. Sensation to touch is normal in both the legs and feet.     LAB DATA:   Lab Results  Component Value Date   HGBA1C 8.2 (A) 01/13/2020   HGBA1C 9.0 (A) 05/27/2019   HGBA1C 8.9 (A) 12/11/2018    : Results for orders placed or performed in visit on 01/13/20 (from the past 672 hour(s))  POCT Glucose (Device for Home Use)   Collection Time: 01/13/20 11:05 AM  Result Value Ref Range   Glucose Fasting, POC     POC Glucose 160 (A) 70 - 99 mg/dl  POCT glycosylated hemoglobin (Hb A1C)   Collection Time: 01/13/20 11:05 AM    Result Value Ref Range   Hemoglobin A1C 8.2 (A) 4.0 - 5.6 %   HbA1c  POC (<> result, manual entry)     HbA1c, POC (prediabetic range)     HbA1c, POC (controlled diabetic range)       Assessment and Plan:  Assessment  ASSESSMENT:   Darick is a 19 y.o. Caucasian male with type 1 diabetes since age 69.    Type 1 diabetes, uncontrolled - blood sugars are overall comparable to last visit.  - Now wearing CGM most of the time - Wearing diabetes ID - A1C as above (improved but still above ADA recommended <7%) - POC glucose as above - Wearing Medtronic Paradigm pump which is out of warranty. Has not been able to afford a new pump. Interested in [employer].  - Adjustments made to insulin pump settings as follows:  Basal changes MN 1.65 -> 1.8 4 1.4 -> 1.5 8 1.35 2p 1.8 -> 1.9 6 1.8 -> 1.9  Carb ratio MN  5  Target MN 130 6 120 10p 130  Sensitivity 20  Celiac - eating gluten free most of the time - has started to notice more when he eats gluten accidentally.    Follow-up: Return in about 3 months (around 04/12/2020).       Lelon Huh, MD >40 minutes spent today reviewing the medical chart, counseling the patient/family, and documenting today's encounter. When a patient is on insulin, intensive monitoring of blood glucose levels is necessary to avoid hyperglycemia and hypoglycemia. Severe hyperglycemia/hypoglycemia can lead to hospital admissions and be life threatening.

## 2020-01-13 NOTE — Patient Instructions (Addendum)
  [facility] [place], [place] [address] [phone] I have personally seen Dr Nada Maclachlan and Dr. Ellie Lunch  Basal changes MN 1.65 -> 1.8 4 1.4 -> 1.5 8 1.35 2p 1.8 -> 1.9 6 1.8 -> 1.9  [email or web address]  Will fax investigation of benefits to [employer] for T-Slim

## 2020-01-13 NOTE — Telephone Encounter (Signed)
BCBS will not cover a Dexcom for Medtronic, please call.

## 2020-01-15 ENCOUNTER — Telehealth (INDEPENDENT_AMBULATORY_CARE_PROVIDER_SITE_OTHER): Payer: Self-pay | Admitting: Pharmacist

## 2020-01-15 DIAGNOSIS — E109 Type 1 diabetes mellitus without complications: Secondary | ICD-10-CM

## 2020-01-15 MED ORDER — DEXCOM G6 SENSOR MISC: 1.0000 | 11 refills | Status: AC

## 2020-01-15 MED ORDER — DEXCOM G6 TRANSMITTER MISC: 1.0000 | 3 refills | Status: AC

## 2020-01-15 NOTE — Telephone Encounter (Signed)
Called patient's insurance ([place])  Although Dexcom G6 CGM is listed as a preferred option on online drug formulary for [place] state plan representative confirmed that medication is not covered via pharmacy benefits.   There are two options 1) Pharmacy Benefits: Call [phone] to submit a request for a non-formulary agent to be approved. If denied an appeal will be required. 2) Medical Benefits: Go through medical insurance DME.  Called multiple insurance representatives to determine specific DME that insurance is contracted with. Insurance representative was unsure of specific DME that [place] state plan is contracted with. Asked representative for specific DMEs for her to look up to determine if contracted with insurance. Results are listed below. 1) [facility] - denies 2) [facility] - YES 3) [facility] - unable to locate 4) [facility] - denies  Sent in Dexcom G6 CGM prescriptions to [facility]. Called [facility] to determine copay. [facility] representative stated that it takes [facility] 48 hours to process prescriptions and they are unable determine copay until Tuesday  (01/19/2020).   Followed up with patient's mother and explained that they will need to go through [facility] for Dexcom G6 and are able to receive other diabetes medical equiment via [facility]. Mrs. Garn states they used to go through [employer]. Explained to her I spoke with insurance representative who confirmed that [employer] is not contracted through their insurance but Benedict Needy is (may be a recent change in 2021 year). She states she has been upset with [facility] due to how running out of diabetes supplies and shipment time has been prolonged lately. Informed her she will be able to call on Tuesday to determine cost of Dexcom. Provided Mrs. Burningham with phone number [phone]). Mrs. Schwake verbalized understanding and expressed appreciation for the call.  Thank you for involving pharmacy/diabetes educator to assist in providing this  patient's care.   [name], PharmD PGY2 Ambulatory Care Pharmacy Resident

## 2020-01-17 NOTE — Telephone Encounter (Signed)
I did put in prescriptions for increased supplies for Dexcom- but to the local pharmacy. He needs increased supplies for reservoirs and sites but I couldn't figure out how to do that in Epic   I thought he was getting all his supplies from [facility]?  Thanks for helping with this.   Lelon Huh, MD

## 2020-01-17 NOTE — Telephone Encounter (Signed)
Yes I saw pharmacy rx - however unfortunately with his insurance dexcom must be covered via medical benefits rather than pharmacy benefits. Called pharmacy & insurance which confirmed this unfortunately will not be covered on pharmacy benefits.  The durable medical equipment (DME) supplier contracted with his insurance is [employer] for 2021 year (may have been different DME supplier previously Oletta Lamas)). So he will have to get Dexcom G6 CGM as well as insulin pump supplies (reservoirs, sites) through [facility]. Hopefully this makes sense. If not, let me know and I will try to swing by your office on Wed to explain better.  As for insulin pump reservoirs/sites I am not entirely sure how to order these. I will reach out to [facility] to see how she has been doing this and keep you updated.   No problem! Happy to help!

## 2020-01-19 ENCOUNTER — Telehealth (INDEPENDENT_AMBULATORY_CARE_PROVIDER_SITE_OTHER): Payer: Self-pay | Admitting: Pediatric Endocrinology

## 2020-01-19 ENCOUNTER — Telehealth (INDEPENDENT_AMBULATORY_CARE_PROVIDER_SITE_OTHER): Payer: Self-pay | Admitting: Pharmacist

## 2020-01-19 NOTE — Telephone Encounter (Signed)
Please advised  

## 2020-01-19 NOTE — Telephone Encounter (Signed)
  Who's calling (name and relationship to patient) : Stephen Thompson- Mom   Best contact number: [phone]  Provider they see: Dr Baldo Ash   Reason for call: Mom called to speak with [name] about some questions she has regarding dexcom.     PRESCRIPTION REFILL ONLY  Name of prescription:  Pharmacy:

## 2020-01-19 NOTE — Telephone Encounter (Signed)
Called patient on 01/19/2020 at 5:43 PM and left HIPAA-compliant VM with instructions to call Pediatric Endocrinology clinic back   Returned call to discuss Dexcom G6 CGM questions.  [name], PharmD PGY2 Ambulatory Care Pharmacy Resident

## 2020-01-20 ENCOUNTER — Telehealth (INDEPENDENT_AMBULATORY_CARE_PROVIDER_SITE_OTHER): Payer: Self-pay | Admitting: Pharmacist

## 2020-01-20 NOTE — Telephone Encounter (Signed)
Called patient on 01/20/2020 at 11:57 AM and left HIPAA-compliant VM at [phone] phone number with instructions to call Pediatric Endocrinologist clinic back   [place] [phone] and [phone]; unable to leave VM at these phone numbers.  Plan to answer Dexcom G6 CGM questions and determine specific insulin supplies used to submit request to [facility].   Thank you for involving pharmacy to assist in providing this patient's care.   [name], PharmD PGY2 Ambulatory Care Pharmacy Resident

## 2020-01-22 NOTE — Telephone Encounter (Signed)
[facility] form completed and given to provider for signature

## 2020-01-29 ENCOUNTER — Telehealth (INDEPENDENT_AMBULATORY_CARE_PROVIDER_SITE_OTHER): Payer: Self-pay

## 2020-01-29 NOTE — Telephone Encounter (Signed)
Mom contacted [name] and informed that patient is out of his pump supplies, and wanted an update on the [facility] order. Mary sent this medical assistant a message asking for assistance in this as she is not located in our office today.   This medical assistant reached out to mom and informed her we have pump supplies here that they can pick up, and a follow up call will be made to [facility] to see the status of the order.   Spoke with Rosemarie Ax at [facility] about the status of the patient's supplies, and she informs that the quantities on the order were incorrect. They are going to fax out a standard written order, and a request for chart notes so that they can complete this order. Will inform family when they pick up the pump supplies of the status.

## 2020-02-03 ENCOUNTER — Telehealth (INDEPENDENT_AMBULATORY_CARE_PROVIDER_SITE_OTHER): Payer: Self-pay | Admitting: Pharmacist

## 2020-02-03 NOTE — Telephone Encounter (Signed)
Spoke with [employer] representative who was able to approve patient's application to receive DME supplies from [facility]. Insurance representative stated that patient has $778 deductible to meet prior to insurance coverage being applied for DME supplies. Insurance will cover 80% of DME supply costs once deductible is met. As of 01/21/2020, ~$400 of individual patient deductible have been paid (total deductible = $1250; family deductible [phone]) for medical benefits.   Insurance representative was able to confirm cost of DME supplies (listed below): --It will cost $1,099 for 90 day supply of Dexcom CGM supplies (sensors, transmitters) AND insulin pump supplies (infusion sets, reservoirs).    --It will cost $430.10 for 90 day supply of solely  insulin pump supplies (infusion sets, reservoirs).   --It will cost $267.20 for 30 day supply of solely insulin pump supplies (infusion sets, reservoirs).  [facility] insurance representative stated expedited shipping is available for patient considering he is out of insulin pump supplies; will ship in 2-3 days for $0 additional fees. One day shipping is available as well and will cost ~$80. Patient must pay copay prior to shipment.   Called and spoke with patient's mother [name]) to provide status update. [name] confirmed that these costs are more affordable (likely because [employer] was not an [employer] DME supplier contracted with her insurance and was likely an out-of-network DME supplier). She states patient will be out of Dexcom CGM supplies; provided patient with one sample of Dexcom G6 sensor and transmitter to pick up from office when she is able to come by. Patient's mother expressed appreciation for the call.  Thank you for involving pharmacy to assist in providing this patient's care.   [name], PharmD PGY2 Ambulatory Care Pharmacy Resident

## 2020-02-08 NOTE — Telephone Encounter (Signed)
Receive a phone call from [facility] indicating that a date for when the pump warranty went out is required for them to move forward.   Left hipaa compliant voicemail for patient to call back so we can assist in movign forward.

## 2020-03-10 ENCOUNTER — Other Ambulatory Visit (INDEPENDENT_AMBULATORY_CARE_PROVIDER_SITE_OTHER): Payer: Self-pay | Admitting: Family

## 2020-03-10 DIAGNOSIS — E1065 Type 1 diabetes mellitus with hyperglycemia: Secondary | ICD-10-CM

## 2020-04-12 ENCOUNTER — Other Ambulatory Visit (INDEPENDENT_AMBULATORY_CARE_PROVIDER_SITE_OTHER): Payer: Self-pay | Admitting: Pediatric Endocrinology

## 2020-04-13 ENCOUNTER — Ambulatory Visit (INDEPENDENT_AMBULATORY_CARE_PROVIDER_SITE_OTHER): Payer: BC Managed Care – PPO | Admitting: Pediatric Endocrinology

## 2020-04-13 ENCOUNTER — Other Ambulatory Visit: Payer: Self-pay

## 2020-04-13 ENCOUNTER — Encounter (INDEPENDENT_AMBULATORY_CARE_PROVIDER_SITE_OTHER): Payer: Self-pay | Admitting: Pediatric Endocrinology

## 2020-04-13 VITALS — BP 124/76 | HR 60 | Wt 197.2 lb

## 2020-04-13 DIAGNOSIS — E109 Type 1 diabetes mellitus without complications: Secondary | ICD-10-CM

## 2020-04-13 DIAGNOSIS — Z4681 Encounter for fitting and adjustment of insulin pump: Secondary | ICD-10-CM

## 2020-04-13 LAB — POCT GLUCOSE (DEVICE FOR HOME USE): POC Glucose: 190 mg/dl — AB (ref 70–99)

## 2020-04-13 LAB — POCT GLYCOSYLATED HEMOGLOBIN (HGB A1C): Hemoglobin A1C: 6.8 % — AB (ref 4.0–5.6)

## 2020-04-13 NOTE — Progress Notes (Signed)
Subjective:  Subjective  Patient Name: Stephen Thompson Date of Birth: Oct 01, 2001  MRN: [id number]  Stephen Thompson  presents to the office today for follow-up evaluation and management of his type 1 diabetes, hypoglycemia, goiter, and celiac disease.    HISTORY OF PRESENT ILLNESS:   Stephen Thompson is a 19 y.o. Caucasian male    Stephen Thompson was accompanied by himself   1. Stephen Thompson was admitted to the PICU at [facility] on 10/31/11 for evaluation and management of new-onset T1DM, DKA, dehydration, and weight loss. After being successfully treated with iv insulin, he was transferred to the Pediatric Ward. He was transitioned to MDI with Novolog and Lantus. His most recent Lantus dose was 8 units. He was on the Novolog 150/50/30 two-component plan.  On 08/26/12 he was converted to a Medtronic Paradigm 723 insulin pump.     2. The patient's last PSSG visit was on 01/13/20. In the interim, he has been doing well.    He was able to switch to a Tandem T-Slim pump this winter. He is frustrated that it alarms whenever his sugar is above 180. It will sometimes alarm for extended intervals at night which is very frustrating. He has not been able to find the menu to adjust this setting. He was never actually trained on this pump as he was meant to be trained by the Tandem rep but they were flaky.   He feels that he was paying more attention to his sugar when he first got this pump. He is bolusing when he eats and sometimes for his sugar. He feels that the pump does a good job of "handleing the rest".   He is using Control IQ on the new pump.   He is doing remote college virtually. He has gotten his Covid Vaccines.   Celiac  He is eating gluten free all of the time. He is symptomatic if he accidentally gets gluten.   He feels that overall his Tachycardia is better.    3. Pertinent Review of Systems:  Constitutional: The patient feels "good". The patient seems healthy and active. Eyes: Vision  seems to be good. There are no recognized eye problems. Eye exam in March 2016. Due 2021 Feels that his vision is getting worse. Still has not scheduled.  Neck: The patient has no complaints of anterior neck swelling, soreness, tenderness, pressure, discomfort, or difficulty swallowing.   Heart: Heart rate increases with exercise or other physical activity. The patient has no complaints of palpitations, irregular heart beats, chest pain, or chest pressure.   Lungs: no asthma or wheezing.  Gastrointestinal: Bowel movents seem normal. The patient has no complaints of excessive hunger, acid reflux, upset stomach, stomach aches or pains, diarrhea, or constipation.  Legs: Muscle mass and strength seem normal. There are no complaints of numbness, tingling, burning, or pain. No edema is noted.  Feet: There are no obvious foot problems. There are no complaints of numbness, tingling, burning, or pain. No edema is noted. Neurologic: There are no recognized problems with muscle movement and strength, sensation, or coordination. GYN/GU: normal   Diabetes ID: MY ID bracelet.    Annual labs last done June 2020 -Due next visit.   Pump download: Tandem t-slim:  Avg BG (5 values) 362. 80% above target. 48% basal on pump. 18% of insulin is auto bolus on control iq.    Last visit:  avg BG 258 +/- 74. 87% above target. 38% basal. 209 +/- 77 grams of carb per day.  CGM/Dexcom  avg SG 177. Range 40-400. 40% >180, 60% in target. <1% below target.   Last visit:     PAST MEDICAL, FAMILY, AND SOCIAL HISTORY  Past Medical History:  Diagnosis Date  . Celiac disease 02/24/2012   Recently diagnosed  . Diabetes mellitus   . Type 1 diabetes mellitus not at goal [name]) 10/31/2011    Family History  Problem Relation Age of Onset  . Diabetes Paternal Grandfather        type 2  . Hyperthyroidism Maternal Grandmother   . Thyroid disease Maternal Grandmother   . Multiple sclerosis Maternal Grandfather   .  Diabetes Maternal Grandfather        type 2  . Heart failure Cousin   . Celiac disease Neg Hx      Current Outpatient Medications:  .  ACCU-CHEK FASTCLIX LANCETS MISC, 1 Container by Does not apply route See admin instructions. Check blood sugar 10 times daily Dispense 300 lancets per month., Disp: 300 each, Rfl: 6 .  Continuous Blood Gluc Sensor (DEXCOM G6 SENSOR) MISC, Inject 1 applicator into the skin as directed. Change sensor every 30 days., Disp: 3 each, Rfl: 11 .  Continuous Blood Gluc Transmit (DEXCOM G6 TRANSMITTER) MISC, Inject 1 kit into the skin as directed. Reuse transmitter 8 times., Disp: 1 each, Rfl: 3 .  glucagon (GLUCAGEN HYPOKIT) 1 MG SOLR injection, GLUCAGEN HYPOKIT DOUBLE PACK. Inject 1 mg into anterior thigh muscle if unconscious, unable to swallow, unresponsive and/or has a seizure, Disp: 2 each, Rfl: 1 .  insulin aspart (NOVOLOG) 100 UNIT/ML injection, USE 300 UNITS IN INSULIN PUMP EVERY 48 HOURS AS DIRECTED, Disp: 40 mL, Rfl: 5 .  levothyroxine (SYNTHROID) 25 MCG tablet, TAKE 1 TABLET (25 MCG TOTAL) BY MOUTH DAILY BEFORE BREAKFAST., Disp: 90 tablet, Rfl: 1 .  chlorhexidine (PERIDEX) 0.12 % solution, , Disp: , Rfl:  .  Glucagon (BAQSIMI TWO PACK) 3 MG/DOSE POWD, Place 1 each into the nose as needed (severe hypoglycemia with unresponsiveness). (Patient not taking: Reported on 05/27/2019), Disp: 1 each, Rfl: 3 .  Glucagon, rDNA, (GLUCAGON EMERGENCY) 1 MG KIT, PLEASE SEE ATTACHED FOR DETAILED DIRECTIONS, Disp: , Rfl:  .  glucose blood (ACCU-CHEK GUIDE) test strip, Use as instructed for 6 checks per day plus per protocol for hyper/hypoglycemia (Patient not taking: Reported on 05/27/2019), Disp: 200 each, Rfl: 3 .  insulin aspart (NOVOLOG FLEXPEN) 100 UNIT/ML FlexPen, Inject into skin if insulin pump fails, per diabetes plan (Patient not taking: Reported on 12/25/2016), Disp: 5 mL, Rfl: 3 .  insulin glargine (LANTUS) 100 UNIT/ML injection, Use as directed by physician if insulin pump  fails. (Patient not taking: Reported on 12/25/2016), Disp: 5 mL, Rfl: 3 .  Insulin Pen Needle 32G X 4 MM MISC, Inject 1 each into the skin as needed. Reported on 05/08/2016, Disp: , Rfl:   Allergies as of 04/13/2020 - Review Complete 04/13/2020  Allergen Reaction Noted  . Wheat  12/20/2011     reports that he has never smoked. He has never used smokeless tobacco. He reports that he does not drink alcohol or use drugs. Pediatric History  Patient Parents  . Goodhart,Alexandra (Mother)   Other Topics Concern  . Not on file  Social History Narrative   Mom has primary custody. 8th grade. Soccer  Parents have joint custody per mother 12/20/2011. Dad in rehab as of fall 2014    Primary Care Provider: Orpha Bur, DO   Rising Sophomore at [employer]:  There are no other significant problems involving Kimari's other body systems.    Objective:  Objective   Vital Signs:  BP 124/76   Pulse 60   Wt 197 lb 3.2 oz (89.4 kg)   BMI 30.06 kg/m    Ht Readings from Last 3 Encounters:  01/13/20 5' 7.91" (1.725 m) (27 %, Z= -0.61)*  05/27/19 5' 7.6" (1.717 m) (24 %, Z= -0.70)*  12/11/18 5' 8"  (1.727 m) (30 %, Z= -0.54)*   * Growth percentiles are based on CDC (Boys, 2-20 Years) data.   Wt Readings from Last 3 Encounters:  04/13/20 197 lb 3.2 oz (89.4 kg)  01/13/20 206 lb 12.8 oz (93.8 kg) (94 %, Z= 1.55)*  05/27/19 183 lb 9.6 oz (83.3 kg) (85 %, Z= 1.02)*   * Growth percentiles are based on CDC (Boys, 2-20 Years) data.   HC Readings from Last 3 Encounters:  No data found for [facility]   Body surface area is 2.07 meters squared. Facility age limit for growth percentiles is 20 years. Facility age limit for growth percentiles is 20 years.    PHYSICAL EXAM:  Constitutional: The patient appears active and engaged.  The patient's height and weight are normal for age. He has lost weight since last visit.  Head: The head is normocephalic. Face: The face appears normal. There are  no obvious dysmorphic features. Eyes: The eyes appear to be normally formed and spaced. Gaze is conjugate. There is no obvious arcus or proptosis. Moisture appears normal.  Ears: The ears are normally placed and appear externally normal. Mouth: The oropharynx and tongue appear normal. Dentition appears to be normal for age. Oral moisture is normal.  Neck: The neck appears to be visibly normal. The thyroid gland is 14 grams in size. The consistency of the thyroid gland is normal. The thyroid gland is not tender to palpation. Lungs: No increased work of breathing Heart: normal pulses and peripheral perfusion Abdomen: The abdomen appears to be normal in size for the patient's age. There is no obvious hepatomegaly, splenomegaly, or other mass effect. Arms: Muscle size and bulk are normal for age. Hands: There is no obvious tremor. Phalangeal and metacarpophalangeal joints are normal. Palmar muscles are normal for age. Palmar skin is normal. Palmar moisture is also normal. Legs: Muscles appear normal for age. No edema is present. Feet: Feet are normally formed. Dorsalis pedal pulses are normal. Neurologic: Strength is normal for age in both the upper and lower extremities. Muscle tone is normal. Sensation to touch is normal in both the legs and feet.     LAB DATA:   Lab Results  Component Value Date   HGBA1C 6.8 (A) 04/13/2020   HGBA1C 8.2 (A) 01/13/2020   HGBA1C 9.0 (A) 05/27/2019   HGBA1C 8.9 (A) 12/11/2018   HGBA1C 9.6 (A) 07/07/2018   HGBA1C 9.1 09/23/2017   HGBA1C 9.3 12/25/2016   HGBA1C 10.3 07/12/2016     : Results for orders placed or performed in visit on 04/13/20 (from the past 672 hour(s))  POCT Glucose (Device for Home Use)   Collection Time: 04/13/20  2:18 PM  Result Value Ref Range   Glucose Fasting, POC     POC Glucose 190 (A) 70 - 99 mg/dl  POCT glycosylated hemoglobin (Hb A1C)   Collection Time: 04/13/20  2:18 PM  Result Value Ref Range   Hemoglobin A1C 6.8 (A) 4.0  - 5.6 %   HbA1c POC (<> result, manual entry)     HbA1c, POC (  prediabetic range)     HbA1c, POC (controlled diabetic range)       Assessment and Plan:  Assessment  ASSESSMENT:   Rahil is a 18 y.o. Caucasian male with type 1 diabetes since age 75.   Type 1 diabetes, uncontrolled - blood sugars are overall comparable to last visit.  - Now wearing CGM most of the time - Wearing diabetes ID - A1C as above - marked improvement with switch from Medtronic Paradigm to to Tandem Tslim Control IQ.  - POC glucose as above  - Adjustments made to insulin pump settings as follows: also able to adjust pump settings so that pump will no longer alarm that his sugar is high starting at 172m/dL.   Start time basal correction Carb ratio target   MN 1.6 20 -> 30 5 120   5 1.5 20 -> 30 5 120   8 1.35 20 -> 30 5 120   2pm -> 12p 1.90 20 -> 30 5 120   6p 1.90 20-> 30 5 120   total 39.6 ->  40.9  Pump basal 42.4    Celiac - eating gluten free most of the time - has started to notice more when he eats gluten accidentally.    Follow-up: Return in about 3 months (around 07/13/2020).       JLelon Huh MD >40 minutes spent today reviewing the medical chart, counseling the patient/family, and documenting today's encounter.  When a patient is on insulin, intensive monitoring of blood glucose levels is necessary to avoid hyperglycemia and hypoglycemia. Severe hyperglycemia/hypoglycemia can lead to hospital admissions and be life threatening.

## 2020-04-13 NOTE — Patient Instructions (Signed)
Start time basal correction Carb ratio target   MN 1.6 20 -> 30 5 120   5 1.5 20 -> 30 5 120   8 1.35 20 -> 30 5 120   2pm -> 12p 1.90 20 -> 30 5 120   6p 1.90 20-> 30 5 120   total 39.6 ->  40.9  Pump basal 42.4

## 2020-07-12 ENCOUNTER — Ambulatory Visit (INDEPENDENT_AMBULATORY_CARE_PROVIDER_SITE_OTHER): Payer: BC Managed Care – PPO | Admitting: Pediatric Endocrinology

## 2020-07-12 ENCOUNTER — Other Ambulatory Visit: Payer: Self-pay

## 2020-07-12 ENCOUNTER — Encounter (INDEPENDENT_AMBULATORY_CARE_PROVIDER_SITE_OTHER): Payer: Self-pay | Admitting: Pediatric Endocrinology

## 2020-07-12 VITALS — BP 114/72 | HR 76 | Ht 68.0 in | Wt 194.4 lb

## 2020-07-12 DIAGNOSIS — E109 Type 1 diabetes mellitus without complications: Secondary | ICD-10-CM | POA: Diagnosis not present

## 2020-07-12 DIAGNOSIS — E063 Autoimmune thyroiditis: Secondary | ICD-10-CM

## 2020-07-12 LAB — POCT GLYCOSYLATED HEMOGLOBIN (HGB A1C): Hemoglobin A1C: 7.4 % — AB (ref 4.0–5.6)

## 2020-07-12 LAB — POCT GLUCOSE (DEVICE FOR HOME USE): POC Glucose: 218 mg/dl — AB (ref 70–99)

## 2020-07-12 NOTE — Patient Instructions (Addendum)
Schedule EYE EXAM!!  Please upload pump to T-Connect. You should be able to do this by plugging it into the USB on your computer.   Start time basal correction Carb ratio target   MN 1.6 30 5  120   5 1.5 30 5  120   8 1.35 30 5 120   12p 1.90 30 5 120   6p 1.90 30 5 120   total 40.9 u/24 hr   Pump basal

## 2020-07-12 NOTE — Progress Notes (Signed)
Subjective:  Subjective  Patient Name: Stephen Thompson Date of Birth: December 05, 2001  MRN: [id number]  Stephen Thompson  presents to the office today for follow-up evaluation and management of his type 1 diabetes, hypoglycemia, goiter, and celiac disease.    HISTORY OF PRESENT ILLNESS:   Stephen Thompson is a 19 y.o. Caucasian male    Stephen Thompson was accompanied by himself   1. Stephen Thompson was admitted to the PICU at [facility] on 10/31/11 for evaluation and management of new-onset T1DM, DKA, dehydration, and weight loss. After being successfully treated with iv insulin, he was transferred to the Pediatric Ward. He was transitioned to MDI with Novolog and Lantus. His most recent Lantus dose was 8 units. He was on the Novolog 150/50/30 two-component plan.  On 08/26/12 he was converted to a Medtronic Paradigm 723 insulin pump.     2. The patient's last PSSG visit was on 04/13/20. In the interim, he has been doing well.    No hospital or ER visits since last visit.   He turned back on one of the T-Slim alarms because he found it useful. Sometimes they are annoying. Overall he likes it better.   He is using Control IQ. He feels that he doesn't have to pay as much attention to his sugar as he used to. He has a feeling that his A1C will be higher- but he thinks that the pump does a good job of keeping his sugar stable.   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   Celiac  He is eating gluten free all of the time. He is symptomatic if he accidentally gets gluten.   He feels that overall his Tachycardia is resolved.   Thyroid Continues on 25 mcg of Synthroid daily- he takes it inconsistently in the mornings.   3. Pertinent Review of Systems:  Constitutional: The patient feels "good". The patient seems healthy and active. Eyes: Vision seems to be good. There are no recognized eye problems. Eye exam in March 2016. Due 2021 Feels that his vision is getting worse. Still has  not scheduled.  Neck: The patient has no complaints of anterior neck swelling, soreness, tenderness, pressure, discomfort, or difficulty swallowing.   Heart: Heart rate increases with exercise or other physical activity. The patient has no complaints of palpitations, irregular heart beats, chest pain, or chest pressure.   Lungs: no asthma or wheezing.  Gastrointestinal: Bowel movents seem normal. The patient has no complaints of excessive hunger, acid reflux, upset stomach, stomach aches or pains, diarrhea, or constipation.  Legs: Muscle mass and strength seem normal. There are no complaints of numbness, tingling, burning, or pain. No edema is noted.  Feet: There are no obvious foot problems. There are no complaints of numbness, tingling, burning, or pain. No edema is noted. Neurologic: There are no recognized problems with muscle movement and strength, sensation, or coordination. GYN/GU: normal   Diabetes ID: MY ID bracelet.    Annual labs last done June 2020 -Due today!  Pump download: unable to download   Last visit: Tandem t-slim:  Avg BG (5 values) 362. 80% above target. 48% basal on pump. 18% of insulin is auto bolus on control iq.      CGM/Dexcom  Unable to download  Last visit: avg SG 177. Range 40-400. 40% >180, 60% in target. <1% below target.      PAST MEDICAL, FAMILY, AND SOCIAL HISTORY  Past Medical History:  Diagnosis Date  . Celiac disease 02/24/2012  Recently diagnosed  . Diabetes mellitus   . Type 1 diabetes mellitus not at goal [facility]) 10/31/2011    Family History  Problem Relation Age of Onset  . Diabetes Paternal Grandfather        type 2  . Hyperthyroidism Maternal Grandmother   . Thyroid disease Maternal Grandmother   . Multiple sclerosis Maternal Grandfather   . Diabetes Maternal Grandfather        type 2  . Heart failure Cousin   . Celiac disease Neg Hx      Current Outpatient Medications:  .  ACCU-CHEK FASTCLIX LANCETS MISC, 1 Container by Does  not apply route See admin instructions. Check blood sugar 10 times daily Dispense 300 lancets per month., Disp: 300 each, Rfl: 6 .  chlorhexidine (PERIDEX) 0.12 % solution, , Disp: , Rfl:  .  Continuous Blood Gluc Sensor (DEXCOM G6 SENSOR) MISC, Inject 1 applicator into the skin as directed. Change sensor every 30 days., Disp: 3 each, Rfl: 11 .  Continuous Blood Gluc Transmit (DEXCOM G6 TRANSMITTER) MISC, Inject 1 kit into the skin as directed. Reuse transmitter 8 times., Disp: 1 each, Rfl: 3 .  Glucagon (BAQSIMI TWO PACK) 3 MG/DOSE POWD, Place 1 each into the nose as needed (severe hypoglycemia with unresponsiveness). (Patient not taking: Reported on 05/27/2019), Disp: 1 each, Rfl: 3 .  glucagon (GLUCAGEN HYPOKIT) 1 MG SOLR injection, GLUCAGEN HYPOKIT DOUBLE PACK. Inject 1 mg into anterior thigh muscle if unconscious, unable to swallow, unresponsive and/or has a seizure, Disp: 2 each, Rfl: 1 .  Glucagon, rDNA, (GLUCAGON EMERGENCY) 1 MG KIT, PLEASE SEE ATTACHED FOR DETAILED DIRECTIONS, Disp: , Rfl:  .  glucose blood (ACCU-CHEK GUIDE) test strip, Use as instructed for 6 checks per day plus per protocol for hyper/hypoglycemia (Patient not taking: Reported on 05/27/2019), Disp: 200 each, Rfl: 3 .  insulin aspart (NOVOLOG FLEXPEN) 100 UNIT/ML FlexPen, Inject into skin if insulin pump fails, per diabetes plan (Patient not taking: Reported on 12/25/2016), Disp: 5 mL, Rfl: 3 .  insulin aspart (NOVOLOG) 100 UNIT/ML injection, USE 300 UNITS IN INSULIN PUMP EVERY 48 HOURS AS DIRECTED, Disp: 40 mL, Rfl: 5 .  insulin glargine (LANTUS) 100 UNIT/ML injection, Use as directed by physician if insulin pump fails. (Patient not taking: Reported on 12/25/2016), Disp: 5 mL, Rfl: 3 .  Insulin Pen Needle 32G X 4 MM MISC, Inject 1 each into the skin as needed. Reported on 05/08/2016, Disp: , Rfl:  .  levothyroxine (SYNTHROID) 25 MCG tablet, TAKE 1 TABLET (25 MCG TOTAL) BY MOUTH DAILY BEFORE BREAKFAST., Disp: 90 tablet, Rfl:  1  Allergies as of 07/12/2020 - Review Complete 04/13/2020  Allergen Reaction Noted  . Wheat  12/20/2011     reports that he has never smoked. He has never used smokeless tobacco. He reports that he does not drink alcohol and does not use drugs. Pediatric History  Patient Parents  . Yale,Alexandra (Mother)   Other Topics Concern  . Not on file  Social History Narrative   Mom has primary custody. 8th grade. Soccer  Parents have joint custody per mother 12/20/2011. Dad in rehab as of fall 2014    Primary Care Provider: Orpha Bur, DO   Stephen Thompson (yr)/Sr (credit)  at [employer]  ROS: There are no other significant problems involving Stephen Thompson's other body systems.    Objective:  Objective   Vital Signs:  BP 114/72   Pulse 76   Ht 5' 8"  (1.727 m)  Wt 194 lb 6.4 oz (88.2 kg)   BMI 29.56 kg/m    Ht Readings from Last 3 Encounters:  07/12/20 5' 8"  (1.727 m)  01/13/20 5' 7.91" (1.725 m) (27 %, Z= -0.61)*  05/27/19 5' 7.6" (1.717 m) (24 %, Z= -0.70)*   * Growth percentiles are based on CDC (Boys, 2-20 Years) data.   Wt Readings from Last 3 Encounters:  07/12/20 194 lb 6.4 oz (88.2 kg)  04/13/20 197 lb 3.2 oz (89.4 kg)  01/13/20 206 lb 12.8 oz (93.8 kg) (94 %, Z= 1.55)*   * Growth percentiles are based on CDC (Boys, 2-20 Years) data.   HC Readings from Last 3 Encounters:  No data found for [facility]   Body surface area is 2.06 meters squared. Facility age limit for growth percentiles is 20 years. Facility age limit for growth percentiles is 20 years.  PHYSICAL EXAM:  Constitutional: The patient appears active and engaged.  The patient's height and weight are normal for age. He has lost another 3 pounds since last visit.  Head: The head is normocephalic. Face: The face appears normal. There are no obvious dysmorphic features. Eyes: The eyes appear to be normally formed and spaced. Gaze is conjugate. There is no obvious arcus or proptosis. Moisture  appears normal.  Ears: The ears are normally placed and appear externally normal. Mouth: The oropharynx and tongue appear normal. Dentition appears to be normal for age. Oral moisture is normal.  Neck: The neck appears to be visibly normal. The thyroid gland is 14 grams in size. The consistency of the thyroid gland is normal. The thyroid gland is not tender to palpation. Lungs: No increased work of breathing Heart: normal pulses and peripheral perfusion Abdomen: The abdomen appears to be normal in size for the patient's age. There is no obvious hepatomegaly, splenomegaly, or other mass effect. Arms: Muscle size and bulk are normal for age. Hands: There is no obvious tremor. Phalangeal and metacarpophalangeal joints are normal. Palmar muscles are normal for age. Palmar skin is normal. Palmar moisture is also normal. Legs: Muscles appear normal for age. No edema is present. Feet: Feet are normally formed. Dorsalis pedal pulses are normal. Neurologic: Strength is normal for age in both the upper and lower extremities. Muscle tone is normal. Sensation to touch is normal in both the legs and feet.     LAB DATA:    Lab Results  Component Value Date   HGBA1C 7.4 (A) 07/12/2020   HGBA1C 6.8 (A) 04/13/2020   HGBA1C 8.2 (A) 01/13/2020   HGBA1C 9.0 (A) 05/27/2019   HGBA1C 8.9 (A) 12/11/2018   HGBA1C 9.6 (A) 07/07/2018   HGBA1C 9.1 09/23/2017   HGBA1C 9.3 12/25/2016     : Results for orders placed or performed in visit on 07/12/20 (from the past 672 hour(s))  POCT Glucose (Device for Home Use)   Collection Time: 07/12/20  2:45 PM  Result Value Ref Range   Glucose Fasting, POC     POC Glucose 218 (A) 70 - 99 mg/dl  POCT glycosylated hemoglobin (Hb A1C)   Collection Time: 07/12/20  2:51 PM  Result Value Ref Range   Hemoglobin A1C 7.4 (A) 4.0 - 5.6 %   HbA1c POC (<> result, manual entry)     HbA1c, POC (prediabetic range)     HbA1c, POC (controlled diabetic range)       Assessment and  Plan:  Assessment  ASSESSMENT:   Istvan is a 19 y.o. Caucasian male with type 1  diabetes since age 63.   Type 1 diabetes, controlled - unable to download devices in clinic today - Now wearing CGM most of the time- he feels that he is not as hypoglycemic but otherwise comparable to last visit.  - Wearing diabetes ID - A1C as above Close to goal - POC glucose as above - Annual labs today.    Start time basal correction Carb ratio target   MN 1.6 30 5  120   5 1.5 30 5  120   8 1.35 30 5 120   12p 1.90 30 5 120   6p 1.90 30 5 120   total 40.9 u/24 hr   Pump basal     Celiac  - eating gluten free most of the time - has started to notice more when he eats gluten accidentally.   Thyroid - On Synthroid 25 mcg daily - Labs today   Follow-up: Return in about 3 months (around 10/12/2020).       Lelon Huh, MD >30 minutes spent today reviewing the medical chart, counseling the patient/family, and documenting today's encounter.   When a patient is on insulin, intensive monitoring of blood glucose levels is necessary to avoid hyperglycemia and hypoglycemia. Severe hyperglycemia/hypoglycemia can lead to hospital admissions and be life threatening.

## 2020-07-13 LAB — COMPREHENSIVE METABOLIC PANEL
AG Ratio: 2.3 (calc) (ref 1.0–2.5)
ALT: 18 U/L (ref 9–46)
AST: 23 U/L (ref 10–40)
Albumin: 4.9 g/dL (ref 3.6–5.1)
Alkaline phosphatase (APISO): 125 U/L (ref 36–130)
BUN: 14 mg/dL (ref 7–25)
CO2: 27 mmol/L (ref 20–32)
Calcium: 9.7 mg/dL (ref 8.6–10.3)
Chloride: 102 mmol/L (ref 98–110)
Creat: 1.16 mg/dL (ref 0.60–1.35)
Globulin: 2.1 g/dL (calc) (ref 1.9–3.7)
Glucose, Bld: 200 mg/dL — ABNORMAL HIGH (ref 65–139)
Potassium: 4.5 mmol/L (ref 3.5–5.3)
Sodium: 138 mmol/L (ref 135–146)
Total Bilirubin: 0.7 mg/dL (ref 0.2–1.2)
Total Protein: 7 g/dL (ref 6.1–8.1)

## 2020-07-13 LAB — MICROALBUMIN / CREATININE URINE RATIO
Creatinine, Urine: 85 mg/dL (ref 20–320)
Microalb Creat Ratio: 4 mcg/mg creat (ref <30)
Microalb, Ur: 0.3 mg/dL

## 2020-07-13 LAB — T4, FREE: Free T4: 1.2 ng/dL (ref 0.8–1.4)

## 2020-07-13 LAB — TSH: TSH: 2.12 mIU/L (ref 0.40–4.50)

## 2020-07-14 ENCOUNTER — Ambulatory Visit (INDEPENDENT_AMBULATORY_CARE_PROVIDER_SITE_OTHER): Payer: BC Managed Care – PPO | Admitting: Pediatric Endocrinology

## 2020-08-03 LAB — HM DIABETES EYE EXAM

## 2020-08-09 ENCOUNTER — Other Ambulatory Visit (INDEPENDENT_AMBULATORY_CARE_PROVIDER_SITE_OTHER): Payer: Self-pay | Admitting: Pediatric Endocrinology

## 2020-09-19 ENCOUNTER — Telehealth (INDEPENDENT_AMBULATORY_CARE_PROVIDER_SITE_OTHER): Payer: Self-pay | Admitting: Pediatric Endocrinology

## 2020-09-19 NOTE — Telephone Encounter (Signed)
  Who's calling (name and relationship to patient) :  Best contact number:  Provider they see:  Reason for call: mom called to say they were told with their insurance they could only use [facility] for the patients Diabetic Supplies. She has had a number of issues with [facility] and talked to her insurance and they have told her she can use [facility] for those supplies. I did verify  he is not needing any supplies right now but she wants to go ahead and make that change in our system and she would really like a call to verify that the change is made she would like to not have to deal with [facility] for anything any longer     PRESCRIPTION REFILL ONLY  Name of prescription:  Pharmacy:

## 2020-09-21 NOTE — Telephone Encounter (Signed)
Contacted [facility] and set up shipment for his diabetic supplies. Contacted mom and let them know they will need to contact [facility] for payment and also to let them know what infusion sets Nazeer uses.   Mom asked about the Covid 19 booster vaccine. Let mom know as of September 24th Patients 18-49 with Type 1 diabetes were eligible for the booster 6 months after their second vaccine. Encouraged mom to ask the scheduling team about any type of proof required that Jaquel meets this criteria and to make a return call to [place] if she needs a letter of some type.

## 2020-10-12 ENCOUNTER — Ambulatory Visit (INDEPENDENT_AMBULATORY_CARE_PROVIDER_SITE_OTHER): Payer: BC Managed Care – PPO | Admitting: Pediatric Endocrinology

## 2020-10-21 ENCOUNTER — Other Ambulatory Visit (INDEPENDENT_AMBULATORY_CARE_PROVIDER_SITE_OTHER): Payer: Self-pay | Admitting: Pediatric Endocrinology

## 2020-10-21 DIAGNOSIS — E1065 Type 1 diabetes mellitus with hyperglycemia: Secondary | ICD-10-CM

## 2020-12-12 ENCOUNTER — Ambulatory Visit (INDEPENDENT_AMBULATORY_CARE_PROVIDER_SITE_OTHER): Payer: BC Managed Care – PPO | Admitting: Pediatric Endocrinology

## 2020-12-12 ENCOUNTER — Other Ambulatory Visit: Payer: Self-pay

## 2020-12-12 ENCOUNTER — Encounter (INDEPENDENT_AMBULATORY_CARE_PROVIDER_SITE_OTHER): Payer: Self-pay | Admitting: Pediatric Endocrinology

## 2020-12-12 VITALS — BP 114/68 | Wt 206.6 lb

## 2020-12-12 DIAGNOSIS — K900 Celiac disease: Secondary | ICD-10-CM

## 2020-12-12 DIAGNOSIS — Z4681 Encounter for fitting and adjustment of insulin pump: Secondary | ICD-10-CM

## 2020-12-12 DIAGNOSIS — E063 Autoimmune thyroiditis: Secondary | ICD-10-CM | POA: Diagnosis not present

## 2020-12-12 DIAGNOSIS — E109 Type 1 diabetes mellitus without complications: Secondary | ICD-10-CM | POA: Diagnosis not present

## 2020-12-12 LAB — POCT GLYCOSYLATED HEMOGLOBIN (HGB A1C): Hemoglobin A1C: 7.4 % — AB (ref 4.0–5.6)

## 2020-12-12 LAB — POCT GLUCOSE (DEVICE FOR HOME USE): POC Glucose: 136 mg/dl — AB (ref 70–99)

## 2020-12-12 NOTE — Patient Instructions (Signed)
  Start time basal correction Carb ratio target   MN 1.6 30 -> 20 5 120   5 1.5 30 -> 20 5 120   8 1.35 30 -> 20 5 120   12p 1.90 30 -> 20 5 120   6p 1.90 30 -> 20 5 120   total 40.9 u/24 hr   Pump basal

## 2020-12-12 NOTE — Progress Notes (Signed)
Subjective:  Subjective  Patient Name: Stephen Thompson Date of Birth: 26-Mar-2001  MRN: [id number]  Stephen Thompson  presents to the office today for follow-up evaluation and management of his type 1 diabetes, hypoglycemia, goiter, and celiac disease.    HISTORY OF PRESENT ILLNESS:   Stephen Thompson is a 19 y.o. Caucasian male    Stephen Thompson was accompanied by himself   1. Stephen Thompson was admitted to the PICU at [facility] on 10/31/11 for evaluation and management of new-onset T1DM, DKA, dehydration, and weight loss. After being successfully treated with iv insulin, he was transferred to the Pediatric Ward. He was transitioned to MDI with Novolog and Lantus. His most recent Lantus dose was 8 units. He was on the Novolog 150/50/30 two-component plan.  On 08/26/12 he was converted to a Medtronic Paradigm 723 insulin pump.     2. The patient's last PSSG visit was on 07/12/20. In the interim, he has been doing well.    No hospital or ER visits since last visit.   He has been using his T-Slim in control IQ most of the time. It is in manual when he is changing out his his Dexcom. He says that it works pretty well most of the time- but there are times when nothing seems to bring his sugar down. He has given himself injected insulin and STILL not been able to bring his sugar into target. He has not been able to identify why this happens.   He says that if his sugar is going to run high it is usually at night. He tends to struggle with remembering to bolus for his dinner. He says that other times of day he does better with bolusing. He also has a 30 minute walk after dinner which makes him nervous about covering all his carbs. However, once his sugar is high it is harder to get it back down.   Review of his pump settings shows that he has a sensitivity of 1:30 overnight and 1:20 during the day.   Celiac  He is eating gluten free all of the time. He is symptomatic if he accidentally gets gluten.  He  has struggled with eating GF in the dining hall. He is dropping his meal plan next semester and will just shop at the store instead.   Thyroid  Continues on 25 mcg of Synthroid daily He is still taking it inconsistently. He thinks he is taking 4-5 doses per week.   3. Pertinent Review of Systems:  Constitutional: The patient feels "good". The patient seems healthy and active. Eyes: Vision seems to be good. There are no recognized eye problems. Eye exam in 2021- His eyes are healthy.  Neck: The patient has no complaints of anterior neck swelling, soreness, tenderness, pressure, discomfort, or difficulty swallowing.   Heart: Heart rate increases with exercise or other physical activity. The patient has no complaints of palpitations, irregular heart beats, chest pain, or chest pressure.   Lungs: no asthma or wheezing.  Gastrointestinal: Bowel movents seem normal. The patient has no complaints of excessive hunger, acid reflux, upset stomach, stomach aches or pains, diarrhea, or constipation.  Legs: Muscle mass and strength seem normal. There are no complaints of numbness, tingling, burning, or pain. No edema is noted.  Feet: There are no obvious foot problems. There are no complaints of numbness, tingling, burning, or pain. No edema is noted. Neurologic: There are no recognized problems with muscle movement and strength, sensation, or coordination. GYN/GU: normal   Diabetes  ID: MY ID bracelet.    Annual labs last done July 2021- no issues  Pump and CGM download:          PAST MEDICAL, FAMILY, AND SOCIAL HISTORY  Past Medical History:  Diagnosis Date   Celiac disease 02/24/2012   Recently diagnosed   Diabetes mellitus    Type 1 diabetes mellitus not at goal [facility]) 10/31/2011    Family History  Problem Relation Age of Onset   Diabetes Paternal Grandfather        type 2   Hyperthyroidism Maternal Grandmother    Thyroid disease Maternal Grandmother    Multiple sclerosis  Maternal Grandfather    Diabetes Maternal Grandfather        type 2   Heart failure Cousin    Celiac disease Neg Hx      Current Outpatient Medications:    ACCU-CHEK FASTCLIX LANCETS MISC, 1 Container by Does not apply route See admin instructions. Check blood sugar 10 times daily Dispense 300 lancets per month., Disp: 300 each, Rfl: 6   ACCU-CHEK GUIDE test strip, USE AS INSTRUCTED FOR 6 CHECKS PER DAY PLUS PER PROTOCOL FOR HYPER/HYPOGLYCEMIA, Disp: 200 strip, Rfl: 3   chlorhexidine (PERIDEX) 0.12 % solution, , Disp: , Rfl:    Continuous Blood Gluc Sensor (DEXCOM G6 SENSOR) MISC, Inject 1 applicator into the skin as directed. Change sensor every 30 days., Disp: 3 each, Rfl: 11   Continuous Blood Gluc Transmit (DEXCOM G6 TRANSMITTER) MISC, Inject 1 kit into the skin as directed. Reuse transmitter 8 times., Disp: 1 each, Rfl: 3   glucagon (GLUCAGEN HYPOKIT) 1 MG SOLR injection, GLUCAGEN HYPOKIT DOUBLE PACK. Inject 1 mg into anterior thigh muscle if unconscious, unable to swallow, unresponsive and/or has a seizure, Disp: 2 each, Rfl: 1   Glucagon, rDNA, (GLUCAGON EMERGENCY) 1 MG KIT, PLEASE SEE ATTACHED FOR DETAILED DIRECTIONS, Disp: , Rfl:    insulin aspart (NOVOLOG FLEXPEN) 100 UNIT/ML FlexPen, Inject into skin if insulin pump fails, per diabetes plan, Disp: 5 mL, Rfl: 3   insulin aspart (NOVOLOG) 100 UNIT/ML injection, USE 300 UNITS IN INSULIN PUMP EVERY 48 HOURS AS DIRECTED, Disp: 40 mL, Rfl: 5   insulin glargine (LANTUS) 100 UNIT/ML injection, Use as directed by physician if insulin pump fails., Disp: 5 mL, Rfl: 3   Insulin Pen Needle 32G X 4 MM MISC, Inject 1 each into the skin as needed. Reported on 05/08/2016, Disp: , Rfl:    levothyroxine (SYNTHROID) 25 MCG tablet, TAKE 1 TABLET (25 MCG TOTAL) BY MOUTH DAILY BEFORE BREAKFAST., Disp: 90 tablet, Rfl: 1   Glucagon (BAQSIMI TWO PACK) 3 MG/DOSE POWD, Place 1 each into the nose as needed (severe hypoglycemia with  unresponsiveness). (Patient not taking: No sig reported), Disp: 1 each, Rfl: 3  Allergies as of 12/12/2020 - Review Complete 04/13/2020  Allergen Reaction Noted   Gluten meal  12/12/2020   Wheat  12/20/2011     reports that he has never smoked. He has never used smokeless tobacco. He reports that he does not drink alcohol and does not use drugs. 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     Primary Care Provider: Orpha Bur, Dionicia Abler (yr)/Sr (credit)  at [employer] Has started taking credits towards  Early Masters Program.   ROS: There are no other significant problems involving Linley's other body systems.    Objective:  Objective   Vital Signs:  BP 114/68    Wt 206 lb 9.6 oz (93.7 kg)    BMI 31.41 kg/m    Ht Readings from Last 3 Encounters:  07/12/20 5' 8"  (1.727 m)  01/13/20 5' 7.91" (1.725 m) (27 %, Z= -0.61)*  05/27/19 5' 7.6" (1.717 m) (24 %, Z= -0.70)*   * Growth percentiles are based on CDC (Boys, 2-20 Years) data.   Wt Readings from Last 3 Encounters:  12/12/20 206 lb 9.6 oz (93.7 kg)  07/12/20 194 lb 6.4 oz (88.2 kg)  04/13/20 197 lb 3.2 oz (89.4 kg)   HC Readings from Last 3 Encounters:  No data found for [facility]   Body surface area is 2.12 meters squared. Facility age limit for growth percentiles is 20 years. Facility age limit for growth percentiles is 20 years.  PHYSICAL EXAM:  Constitutional: The patient appears active and engaged.  The patient's height and weight are normal for age. He has gained 12 pounds since last visit.  Head: The head is normocephalic. Face: The face appears normal. There are no obvious dysmorphic features. Eyes: The eyes appear to be normally formed and spaced. Gaze is conjugate. There is no obvious  arcus or proptosis. Moisture appears normal.  Ears: The ears are normally placed and appear externally normal. Mouth: The oropharynx and tongue appear normal. Dentition appears to be normal for age. Oral moisture is normal.  Neck: The neck appears to be visibly normal. The thyroid gland is 14 grams in size. The consistency of the thyroid gland is normal. The thyroid gland is not tender to palpation. Lungs: No increased work of breathing. CTA  Heart: normal pulses and peripheral perfusion. RRR S1S2 Abdomen: The abdomen appears to be normal in size for the patient's age. There is no obvious hepatomegaly, splenomegaly, or other mass effect. Arms: Muscle size and bulk are normal for age. Hands: There is no obvious tremor. Phalangeal and metacarpophalangeal joints are normal. Palmar muscles are normal for age. Palmar skin is normal. Palmar moisture is also normal. Legs: Muscles appear normal for age. No edema is present. Feet: Feet are normally formed. Dorsalis pedal pulses are normal. Neurologic: Strength is normal for age in both the upper and lower extremities. Muscle tone is normal. Sensation to touch is normal in both the legs and feet.     LAB DATA:    Lab Results  Component Value Date   HGBA1C 7.4 (A) 12/12/2020   HGBA1C 7.4 (A) 07/12/2020   HGBA1C 6.8 (A) 04/13/2020   HGBA1C 8.2 (A) 01/13/2020   HGBA1C 9.0 (A) 05/27/2019   HGBA1C 8.9 (A) 12/11/2018   HGBA1C 9.6 (A) 07/07/2018   HGBA1C 9.1 09/23/2017     : Results for orders placed or performed in visit on 12/12/20 (from the past 672 hour(s))  POCT glycosylated hemoglobin (Hb A1C)   Collection Time: 12/12/20 10:20 AM  Result Value Ref Range   Hemoglobin A1C 7.4 (A) 4.0 - 5.6 %   HbA1c POC (<> result, manual entry)     HbA1c, POC (prediabetic range)     HbA1c, POC (controlled diabetic range)    POCT Glucose (Device for Home Use)   Collection Time: 12/12/20 10:20 AM  Result Value Ref Range   Glucose Fasting, POC     POC  Glucose 136 (A) 70 - 99 mg/dl  Assessment and Plan:  Assessment  ASSESSMENT:   Zaeem is a 19 y.o. Caucasian male with type 1 diabetes since age 72.   Type 1 diabetes, controlled - Reviewed download in detain including CGM and pump settings - Now wearing CGM most of the time- - Wearing diabetes ID - A1C as above Close to goal - POC glucose as above - Pump settings adjusted as follows:    Start time basal correction Carb ratio target   MN 1.6 30 -> 20 5 120   5 1.5 30 -> 20 5 120   8 1.35 30 -> 20 5 120   12p 1.90 30 -> 20 5 120   6p 1.90 30 -> 20 5 120   total 40.9 u/24 hr   Pump basal    Celiac   - eating gluten free most of the time - has started to notice more when he eats gluten accidentally.   Thyroid  - On Synthroid 25 mcg daily - Clinically euthyroid - Chemically euthyroid at last visit.    Follow-up: Return in about 3 months (around 03/12/2021).       Lelon Huh, MD  >40 minutes spent today reviewing the medical chart, counseling the patient/family, and documenting today's encounter. When a patient is on insulin, intensive monitoring of blood glucose levels is necessary to avoid hyperglycemia and hypoglycemia. Severe hyperglycemia/hypoglycemia can lead to hospital admissions and be life threatening.

## 2020-12-19 ENCOUNTER — Other Ambulatory Visit: Payer: BC Managed Care – PPO

## 2021-03-13 ENCOUNTER — Ambulatory Visit (INDEPENDENT_AMBULATORY_CARE_PROVIDER_SITE_OTHER): Payer: BC Managed Care – PPO | Admitting: Pediatric Endocrinology

## 2021-05-28 NOTE — Progress Notes (Signed)
Subjective:  Subjective  Patient Name: Stephen Thompson Date of Birth: [birth year]  MRN: [id number]  Stephen Thompson  presents to the office today for follow-up evaluation and management of his type 1 diabetes, hypoglycemia, goiter, and celiac disease.    HISTORY OF PRESENT ILLNESS:   Stephen Thompson is a 20 y.o. Caucasian male    [name] was accompanied by himself   1. Stephen Thompson was admitted to the PICU at [facility] on 10/31/11 for evaluation and management of new-onset T1DM, DKA, dehydration, and weight loss. After being successfully treated with iv insulin, he was transferred to the Pediatric Ward. He was transitioned to MDI with Novolog and Lantus. His most recent Lantus dose was 8 units. He was on the Novolog 150/50/30 two-component plan.  On 08/26/12 he was converted to a Medtronic Paradigm 723 insulin pump.     2. The patient's last PSSG visit was on 12/12/20. In the interim, he has been doing well.    No hospital or ER visits since last visit.   He is still using T-Slim with Control IQ. He is still having some lows- usually when he is working- it will go to 60 and stay there.   He is working in the [employer] at the [employer]. He works from 1-9 pm. He has dinner around 930-10 pm and goes to bed at 2 am. He does take a bolus before bed and his morning sugar is usually in target. He does not cover his "lunch" when he is working due to wanting his sugar to run higher and not drop as much during his shift. He has been watching his charts and thinks that it is a gradual decline in his glucose.   Review of his pump settings shows that he has a sensitivity of 1:30 overnight and 1:20 during the day.   Celiac  He is eating gluten free all of the time. He is symptomatic if he accidentally gets gluten.  He has struggled with eating GF in the dining hall. He is dropping his meal plan next semester and will just shop at the store instead.   Thyroid  Continues on 25 mcg of Synthroid  daily He is still taking it inconsistently. He thinks he is taking 4-5 doses per week.   3. Pertinent Review of Systems:  Constitutional: The patient feels "good". The patient seems healthy and active. Eyes: Vision seems to be good. There are no recognized eye problems. Eye exam in 2021- His eyes are healthy.  Neck: The patient has no complaints of anterior neck swelling, soreness, tenderness, pressure, discomfort, or difficulty swallowing.   Heart: Heart rate increases with exercise or other physical activity. The patient has no complaints of palpitations, irregular heart beats, chest pain, or chest pressure.   Lungs: no asthma or wheezing.  Gastrointestinal: Bowel movents seem normal. The patient has no complaints of excessive hunger, acid reflux, upset stomach, stomach aches or pains, diarrhea, or constipation.  Legs: Muscle mass and strength seem normal. There are no complaints of numbness, tingling, burning, or pain. No edema is noted.  Feet: There are no obvious foot problems. There are no complaints of numbness, tingling, burning, or pain. No edema is noted. Neurologic: There are no recognized problems with muscle movement and strength, sensation, or coordination. GYN/GU: normal   Diabetes ID: MY ID bracelet.    Annual labs last done July 2021- no issues    Pump and CGM download:  PAST MEDICAL, FAMILY, AND SOCIAL HISTORY  Past Medical History:  Diagnosis Date  . Celiac disease 02/24/2012   Recently diagnosed  . Diabetes mellitus   . Type 1 diabetes mellitus not at goal [facility]) 10/31/2011    Family History  Problem Relation Age of Onset  . Diabetes Paternal Grandfather        type 2  . Hyperthyroidism Maternal Grandmother   . Thyroid disease Maternal Grandmother   . Multiple sclerosis Maternal Grandfather   . Diabetes Maternal Grandfather        type 2  . Heart failure Cousin   . Celiac disease Neg Hx      Current Outpatient Medications:  .   ACCU-CHEK FASTCLIX LANCETS MISC, 1 Container by Does not apply route See admin instructions. Check blood sugar 10 times daily Dispense 300 lancets per month., Disp: 300 each, Rfl: 6 .  Continuous Blood Gluc Sensor (DEXCOM G6 SENSOR) MISC, Inject 1 applicator into the skin as directed. Change sensor every 30 days., Disp: 3 each, Rfl: 11 .  Continuous Blood Gluc Transmit (DEXCOM G6 TRANSMITTER) MISC, Inject 1 kit into the skin as directed. Reuse transmitter 8 times., Disp: 1 each, Rfl: 3 .  insulin aspart (NOVOLOG) 100 UNIT/ML injection, USE 300 UNITS IN INSULIN PUMP EVERY 48 HOURS AS DIRECTED, Disp: 40 mL, Rfl: 5 .  insulin glargine (LANTUS) 100 UNIT/ML injection, Use as directed by physician if insulin pump fails., Disp: 5 mL, Rfl: 3 .  Insulin Pen Needle 32G X 4 MM MISC, Inject 1 each into the skin as needed. Reported on 05/08/2016, Disp: , Rfl:  .  levothyroxine (SYNTHROID) 25 MCG tablet, TAKE 1 TABLET (25 MCG TOTAL) BY MOUTH DAILY BEFORE BREAKFAST., Disp: 90 tablet, Rfl: 1 .  chlorhexidine (PERIDEX) 0.12 % solution, , Disp: , Rfl:  .  Glucagon (BAQSIMI TWO PACK) 3 MG/DOSE POWD, Place 1 each into the nose as needed (severe hypoglycemia with unresponsiveness). (Patient not taking: No sig reported), Disp: 1 each, Rfl: 3 .  glucagon (GLUCAGEN HYPOKIT) 1 MG SOLR injection, GLUCAGEN HYPOKIT DOUBLE PACK. Inject 1 mg into anterior thigh muscle if unconscious, unable to swallow, unresponsive and/or has a seizure, Disp: 2 each, Rfl: 1 .  Glucagon, rDNA, (GLUCAGON EMERGENCY) 1 MG KIT, PLEASE SEE ATTACHED FOR DETAILED DIRECTIONS, Disp: , Rfl:  .  glucose blood (ACCU-CHEK GUIDE) test strip, USE AS INSTRUCTED FOR 6 CHECKS PER DAY PLUS PER PROTOCOL FOR HYPER/HYPOGLYCEMIA, Disp: 200 strip, Rfl: 3 .  insulin aspart (NOVOLOG FLEXPEN) 100 UNIT/ML FlexPen, Inject into skin if insulin pump fails, per diabetes plan (Patient not taking: Reported on 05/29/2021), Disp: 5 mL, Rfl: 3  Allergies as of 05/29/2021 - Review  Complete 05/29/2021  Allergen Reaction Noted  . Gluten meal  12/12/2020  . Wheat  12/20/2011     reports that he has never smoked. He has never used smokeless tobacco. He reports that he does not drink alcohol and does not use drugs. Pediatric History  Patient Parents  . Horen,Alexandra (Mother)   Other Topics Concern  . 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     Primary Care Provider: No primary care provider on file.   Brooke Bonito (yr)/Sr (credit)  at [employer] Has started taking credits towards [employer].   ROS: There are no other significant problems involving Seab's other body systems.  Objective:  Objective   Vital Signs:   BP 112/78 (BP Location: Right Arm, Patient Position: Sitting, Cuff Size: Normal)   Pulse 76   Wt 201 lb 9.6 oz (91.4 kg)   BMI 30.65 kg/m    Ht Readings from Last 3 Encounters:  07/12/20 _0  (1.727 m)  01/13/20 5' 7.91" (1.725 m) (27 %, Z= -0.61)*  05/27/19 5' 7.6" (1.717 m) (24 %, Z= -0.70)*   * Growth percentiles are based on CDC (Boys, 2-20 Years) data.   Wt Readings from Last 3 Encounters:  05/29/21 201 lb 9.6 oz (91.4 kg)  12/12/20 206 lb 9.6 oz (93.7 kg)  07/12/20 194 lb 6.4 oz (88.2 kg)   HC Readings from Last 3 Encounters:  No data found for [facility]   Body surface area is 2.09 meters squared. Facility age limit for growth percentiles is 20 years. Facility age limit for growth percentiles is 20 years.  PHYSICAL EXAM:   Constitutional: The patient appears active and engaged.  The patient's height and weight are normal for age. He has lost 5 pounds since last visit.  Head: The head is normocephalic. Face: The face appears normal. There are no obvious dysmorphic features. Eyes: The eyes appear to be normally formed and spaced. Gaze is conjugate. There is no obvious arcus or proptosis.  Moisture appears normal.  Ears: The ears are normally placed and appear externally normal. Mouth: The oropharynx and tongue appear normal. Dentition appears to be normal for age. Oral moisture is normal.  Neck: The neck appears to be visibly normal. The thyroid gland is 14 grams in size. The consistency of the thyroid gland is normal. The thyroid gland is not tender to palpation. Lungs: No increased work of breathing. CTA  Heart: normal pulses and peripheral perfusion. RRR S1S2 Abdomen: The abdomen appears to be normal in size for the patient's age. There is no obvious hepatomegaly, splenomegaly, or other mass effect. Arms: Muscle size and bulk are normal for age. Hands: There is no obvious tremor. Phalangeal and metacarpophalangeal joints are normal. Palmar muscles are normal for age. Palmar skin is normal. Palmar moisture is also normal. Legs: Muscles appear normal for age. No edema is present. Feet: Feet are normally formed. Dorsalis pedal pulses are normal. Neurologic: Strength is normal for age in both the upper and lower extremities. Muscle tone is normal. Sensation to touch is normal in both the legs and feet.     LAB DATA:     Lab Results  Component Value Date   HGBA1C 7.4 (A) 05/29/2021   HGBA1C 7.4 (A) 12/12/2020   HGBA1C 7.4 (A) 07/12/2020   HGBA1C 6.8 (A) 04/13/2020   HGBA1C 8.2 (A) 01/13/2020   HGBA1C 9.0 (A) 05/27/2019   HGBA1C 8.9 (A) 12/11/2018   HGBA1C 9.6 (A) 07/07/2018     : Results for orders placed or performed in visit on 05/29/21 (from the past 672 hour(s))  POCT Glucose (Device for Home Use)   Collection Time: 05/29/21  1:39 PM  Result Value Ref Range   Glucose Fasting, POC     POC Glucose 208 (A) 70 - 99 mg/dl  POCT glycosylated hemoglobin (Hb A1C)   Collection Time: 05/29/21  1:51 PM  Result Value Ref Range   Hemoglobin A1C 7.4 (A) 4.0 - 5.6 %   HbA1c POC (<> result, manual entry)     HbA1c, POC (prediabetic range)     HbA1c, POC (controlled  diabetic range)       Assessment and  Plan:  Assessment  ASSESSMENT:   Amareon is a 20 y.o. Caucasian male with type 1 diabetes since age 63.   Type 1 diabetes, controlled - Reviewed download in detain including CGM and pump settings - Now wearing CGM most of the time- - Wearing diabetes ID - A1C as above Close to goal - POC glucose as above - No change to pump settings. Discussed using exercise target in pump when at work to decrease frequency of hypoglycemia   Celiac   - eating gluten free most of the time - has started to notice more when he eats gluten accidentally.   Thyroid  - On Synthroid 25 mcg daily - Clinically euthyroid - Chemically euthyroid at last visit.    Follow-up: Return in about 3 months (around 08/29/2021).  Referral placed to Dr. Kelton Pillar at [facility]. OK to cancel follow up here once that is scheduled.       Lelon Huh, MD  >30 minutes spent today reviewing the medical chart, counseling the patient/family, and documenting today's encounter.  When a patient is on insulin, intensive monitoring of blood glucose levels is necessary to avoid hyperglycemia and hypoglycemia. Severe hyperglycemia/hypoglycemia can lead to hospital admissions and be life threatening.

## 2021-05-29 ENCOUNTER — Other Ambulatory Visit: Payer: Self-pay

## 2021-05-29 ENCOUNTER — Ambulatory Visit (INDEPENDENT_AMBULATORY_CARE_PROVIDER_SITE_OTHER): Payer: BC Managed Care – PPO | Admitting: Pediatric Endocrinology

## 2021-05-29 ENCOUNTER — Encounter (INDEPENDENT_AMBULATORY_CARE_PROVIDER_SITE_OTHER): Payer: Self-pay | Admitting: Pediatric Endocrinology

## 2021-05-29 VITALS — BP 112/78 | HR 76 | Wt 201.6 lb

## 2021-05-29 DIAGNOSIS — E109 Type 1 diabetes mellitus without complications: Secondary | ICD-10-CM

## 2021-05-29 LAB — POCT GLUCOSE (DEVICE FOR HOME USE): POC Glucose: 208 mg/dl — AB (ref 70–99)

## 2021-05-29 LAB — POCT GLYCOSYLATED HEMOGLOBIN (HGB A1C): Hemoglobin A1C: 7.4 % — AB (ref 4.0–5.6)

## 2021-05-29 MED ORDER — ACCU-CHEK GUIDE VI STRP: ORAL_STRIP | 3 refills | Status: DC

## 2021-05-29 NOTE — Patient Instructions (Addendum)
   Use the EXERCISE setting on your T-Slim when you go to work. This should change your target from 120 to 150 and give you a smaller correction dose.   [facility]- Dr. Kelton Pillar - Phone: [phone]

## 2021-07-18 ENCOUNTER — Other Ambulatory Visit (INDEPENDENT_AMBULATORY_CARE_PROVIDER_SITE_OTHER): Payer: Self-pay | Admitting: Pediatric Endocrinology

## 2021-08-29 ENCOUNTER — Ambulatory Visit (INDEPENDENT_AMBULATORY_CARE_PROVIDER_SITE_OTHER): Payer: BC Managed Care – PPO | Admitting: Pediatric Endocrinology

## 2021-09-04 ENCOUNTER — Ambulatory Visit (INDEPENDENT_AMBULATORY_CARE_PROVIDER_SITE_OTHER): Payer: BC Managed Care – PPO | Admitting: Internal Medicine

## 2021-09-04 ENCOUNTER — Encounter: Payer: Self-pay | Admitting: Internal Medicine

## 2021-09-04 ENCOUNTER — Other Ambulatory Visit: Payer: Self-pay

## 2021-09-04 VITALS — BP 120/76 | HR 100 | Ht 68.0 in | Wt 198.0 lb

## 2021-09-04 DIAGNOSIS — E11649 Type 2 diabetes mellitus with hypoglycemia without coma: Secondary | ICD-10-CM

## 2021-09-04 DIAGNOSIS — E1065 Type 1 diabetes mellitus with hyperglycemia: Secondary | ICD-10-CM

## 2021-09-04 DIAGNOSIS — E039 Hypothyroidism, unspecified: Secondary | ICD-10-CM

## 2021-09-04 LAB — LIPID PANEL
Cholesterol: 173 mg/dL (ref 0–200)
HDL: 37.8 mg/dL — ABNORMAL LOW (ref >39.00)
LDL Cholesterol: 120 mg/dL — ABNORMAL HIGH (ref 0–99)
NonHDL: 135.14
Total CHOL/HDL Ratio: 5
Triglycerides: 75 mg/dL (ref 0.0–149.0)
VLDL: 15 mg/dL (ref 0.0–40.0)

## 2021-09-04 LAB — BASIC METABOLIC PANEL
BUN: 14 mg/dL (ref 6–23)
CO2: 26 mEq/L (ref 19–32)
Calcium: 9.9 mg/dL (ref 8.4–10.5)
Chloride: 103 mEq/L (ref 96–112)
Creatinine, Ser: 0.91 mg/dL (ref 0.40–1.50)
GFR: 120.39 mL/min (ref >60.00)
Glucose, Bld: 163 mg/dL — ABNORMAL HIGH (ref 70–99)
Potassium: 4.6 mEq/L (ref 3.5–5.1)
Sodium: 138 mEq/L (ref 135–145)

## 2021-09-04 LAB — POCT GLYCOSYLATED HEMOGLOBIN (HGB A1C): Hemoglobin A1C: 8.3 % — AB (ref 4.0–5.6)

## 2021-09-04 LAB — MICROALBUMIN / CREATININE URINE RATIO
Creatinine,U: 170.9 mg/dL
Microalb Creat Ratio: 0.5 mg/g (ref 0.0–30.0)
Microalb, Ur: 0.8 mg/dL (ref 0.0–1.9)

## 2021-09-04 LAB — TSH: TSH: 1.73 u[IU]/mL (ref 0.35–5.50)

## 2021-09-04 MED ORDER — METFORMIN HCL ER 500 MG PO TB24: 1000.0000 mg | ORAL_TABLET | Freq: Two times a day (BID) | ORAL | 1 refills | Status: DC

## 2021-09-04 MED ORDER — INSULIN ASPART 100 UNIT/ML IJ SOLN: INTRAMUSCULAR | 3 refills | Status: DC

## 2021-09-04 NOTE — Progress Notes (Signed)
Name: Stephen Thompson  MRN/ DOB: [id number], [birth year]   Age/ Sex: 20 y.o., male    PCP: Pcp, No   Reason for Endocrinology Evaluation: Type 1 Diabetes Mellitus     Date of Initial Endocrinology Visit: 09/05/2021     PATIENT IDENTIFIER: Stephen Thompson is a 20 y.o. male with a past medical history of T1DM, celiac disease, hypothyroid. The patient presented for initial endocrinology clinic visit on 09/05/2021 for consultative assistance with his diabetes management.    HPI: Stephen Thompson was    Diagnosed with DM at age 62 Prior Medications tried/Intolerance: Was on Medtronic followed by the Tandem  Currently checking blood sugars multiple x / day.  Hypoglycemia episodes : rare               Symptoms: yes                 Frequency: Hemoglobin A1c has ranged from 6.8% in 2021, peaking at 11.0% in 2017. Patient required assistance for hypoglycemia: no Patient has required hospitalization within the last 1 year from hyper or hypoglycemia: no   In terms of diet, the patient 3 meals a day.   Studies computer science   This patient with type 1 diabetes is treated with Tandem  (insulin pump). During the visit the pump basal and bolus doses were reviewed including carb/insulin rations and supplemental doses. The clinical list was updated. The glucose meter download was reviewed in detail to determine if the current pump settings are providing the best glycemic control without excessive hypoglycemia.  Pump and meter download:    Pump   Tandem  Settings   Insulin type   Novolog   Basal rate       0000 1.9 u/h    0500 1.5 u/h    0800 1.35 u/h    1200 1.90   1800 2.2               I:C ratio       0000 5   0500 5   0800 5   1200 5   1800 5          Sensitivity       0000  20   0500 30   0800 20   1200 20   1800 20  Goal       0000  120             Type & Model of Pump: Tandem  Insulin Type: Currently using Novolog .  Body mass index is 30.11  kg/m.  PUMP STATISTICS: Average BG: 192 Average Daily Carbs (g): 124  Average Total Daily Insulin: 105.23  Average Daily Basal: 43 (47 %) Average Daily Bolus: 49 (53 %) Change Frequency : every 2.2 days      THYROID HISTORY: He has been noted with an elevated TSH at 7.061 you IU/mL during labs in 2012.  Anti-TPO antibodies negative   NO FH of autoimmune disease   CELIAC DISEASE: Pt on gluten free diet     HOME DIABETES REGIMEN: Novolog    Statin: no ACE-I/ARB: no Prior Diabetic Education: yes     DIABETIC COMPLICATIONS: Microvascular complications:   Denies: CKD,  retinopathy, neuropathy  Last eye exam: Completed 2021  Macrovascular complications:   Denies: CAD, PVD, CVA   PAST HISTORY: Past Medical History:  Past Medical History:  Diagnosis Date   Celiac disease 02/24/2012   Recently diagnosed   Diabetes mellitus  Type 1 diabetes mellitus not at goal [facility]) 10/31/2011   Past Surgical History:  Past Surgical History:  Procedure Laterality Date   ESOPHAGOGASTRODUODENOSCOPY  12/21/2011   Procedure: ESOPHAGOGASTRODUODENOSCOPY (EGD);  Surgeon: Oletha Blend, MD;  Location: [place];  Service: Gastroenterology;  Laterality: N/A;   NO PAST SURGERIES      Social History:  reports that he has never smoked. He has never used smokeless tobacco. He reports that he does not drink alcohol and does not use drugs. Family History:  Family History  Problem Relation Age of Onset   Diabetes Paternal Grandfather        type 2   Hyperthyroidism Maternal Grandmother    Thyroid disease Maternal Grandmother    Multiple sclerosis Maternal Grandfather    Diabetes Maternal Grandfather        type 2   Heart failure Cousin    Celiac disease Neg Hx      HOME MEDICATIONS: Allergies as of 09/04/2021       Reactions   Gluten Meal    Wheat    Has celiac         Medication List        Accurate as of September 04, 2021 11:59 PM. If you have any questions, ask  your nurse or doctor.          STOP taking these medications    chlorhexidine 0.12 % solution Commonly known as: PERIDEX Stopped by: Dorita Sciara, MD   insulin glargine 100 UNIT/ML injection Commonly known as: LANTUS Stopped by: Dorita Sciara, MD       TAKE these medications    Accu-Chek FastClix Lancets Misc 1 Container by Does not apply route See admin instructions. Check blood sugar 10 times daily Dispense 300 lancets per month.   Accu-Chek Guide test strip Generic drug: glucose blood USE AS INSTRUCTED FOR 6 CHECKS PER DAY PLUS PER PROTOCOL FOR HYPER/HYPOGLYCEMIA   Dexcom G6 Sensor Misc Inject 1 applicator into the skin as directed. Change sensor every 30 days.   Dexcom G6 Transmitter Misc Inject 1 kit into the skin as directed. Reuse transmitter 8 times.   GlucaGen HypoKit 1 MG Solr injection Generic drug: glucagon GLUCAGEN HYPOKIT DOUBLE PACK. Inject 1 mg into anterior thigh muscle if unconscious, unable to swallow, unresponsive and/or has a seizure   Glucagon 3 MG/DOSE Powd Commonly known as: Baqsimi Two Pack Place 1 each into the nose as needed (severe hypoglycemia with unresponsiveness).   Glucagon Emergency 1 MG Kit PLEASE SEE ATTACHED FOR DETAILED DIRECTIONS   insulin aspart 100 UNIT/ML injection Commonly known as: NovoLOG Max daily 150 units per pump What changed:  See the new instructions. Another medication with the same name was removed. Continue taking this medication, and follow the directions you see here. Changed by: Dorita Sciara, MD   Insulin Pen Needle 32G X 4 MM Misc Inject 1 each into the skin as needed. Reported on 05/08/2016   levothyroxine 25 MCG tablet Commonly known as: SYNTHROID TAKE 1 TABLET (25 MCG TOTAL) BY MOUTH DAILY BEFORE BREAKFAST.   metFORMIN 500 MG 24 hr tablet Commonly known as: GLUCOPHAGE-XR Take 2 tablets (1,000 mg total) by mouth in the morning and at bedtime. Started by: Dorita Sciara, MD         ALLERGIES: Allergies  Allergen Reactions   Gluten Meal    Wheat     Has celiac      REVIEW OF SYSTEMS: A comprehensive ROS was  conducted with the patient and is negative except as per HPI and below:  Review of Systems  Gastrointestinal:  Negative for nausea and vomiting.  Neurological:  Negative for tingling.     OBJECTIVE:   VITAL SIGNS: BP 120/76 (BP Location: Left Arm, Patient Position: Sitting, Cuff Size: Small)   Pulse 100   Ht _0  (1.727 m)   Wt 198 lb (89.8 kg)   SpO2 99%   BMI 30.11 kg/m    PHYSICAL EXAM:  General: Pt appears well and is in NAD  Neck: General: Supple without adenopathy or carotid bruits. Thyroid: Thyroid size normal.  No goiter or nodules appreciated.   Lungs: Clear with good BS bilat with no rales, rhonchi, or wheezes  Heart: RRR with normal S1 and S2 and no gallops; no murmurs; no rub  Abdomen: Normoactive bowel sounds, soft, nontender, without masses or organomegaly palpable  Extremities:  Lower extremities - No pretibial edema. No lesions.  Skin: Normal texture and temperature to palpation. No rash noted. No Acanthosis nigricans/skin tags. No lipohypertrophy.  Neuro: MS is good with appropriate affect, pt is alert and Ox3    DM foot exam: 09/04/2021  The skin of the feet is intact without sores or ulcerations. The pedal pulses are 2+ on right and 2+ on left. The sensation is intact to a screening 5.07, 10 gram monofilament bilaterally   DATA REVIEWED:  Lab Results  Component Value Date   HGBA1C 8.3 (A) 09/04/2021   HGBA1C 7.4 (A) 05/29/2021   HGBA1C 7.4 (A) 12/12/2020   Results for KANOA, PHILLIPPI (MRN [id number]) as of 09/05/2021 12:37  Ref. Range 09/04/2021 14:09  Sodium Latest Ref Range: 135 - 145 mEq/L 138  Potassium Latest Ref Range: 3.5 - 5.1 mEq/L 4.6  Chloride Latest Ref Range: 96 - 112 mEq/L 103  CO2 Latest Ref Range: 19 - 32 mEq/L 26  Glucose Latest Ref Range: 70 - 99 mg/dL 163 (H)  BUN  Latest Ref Range: 6 - 23 mg/dL 14  Creatinine Latest Ref Range: 0.40 - 1.50 mg/dL 0.91  Calcium Latest Ref Range: 8.4 - 10.5 mg/dL 9.9  GFR Latest Ref Range: >60.00 mL/min 120.39  Total CHOL/HDL Ratio Unknown 5  Cholesterol Latest Ref Range: 0 - 200 mg/dL 173  HDL Cholesterol Latest Ref Range: >39.00 mg/dL 37.80 (L)  LDL (calc) Latest Ref Range: 0 - 99 mg/dL 120 (H)  MICROALB/CREAT RATIO Latest Ref Range: 0.0 - 30.0 mg/g 0.5  NonHDL Unknown 135.14  Triglycerides Latest Ref Range: 0.0 - 149.0 mg/dL 75.0  VLDL Latest Ref Range: 0.0 - 40.0 mg/dL 15.0  TSH Latest Ref Range: 0.35 - 5.50 uIU/mL 1.73  Creatinine,U Latest Units: mg/dL 170.9  Microalb, Ur Latest Ref Range: 0.0 - 1.9 mg/dL 0.8    ASSESSMENT / PLAN / RECOMMENDATIONS:   1) Type 1 Diabetes Mellitus, Poorly controlled, Without complications - Most recent A1c of 8.3 %. Goal A1c < 7.0 %.     - Pt endorses postprandial  hyperglycemia and inability to correct high glucose readings despite changing the pump site etc. Pt has been noted with weight gain since 2019 and is becoming more insulin resistant.  - We discussed add-on therapy with Metformin to improve resistance, he is in  agreement of this, cautioned against GI side effects.  - I will adjust his I:C ratio and SF as below    MEDICATIONS: Start Metformin 500 mg XR 500 mg , 2 tabs BID - titration provided  Novolog  Basal rate       0000 1.9  u/hr   0200 1.8    0500 1.5    0800 1.35    1200 1.90   1800 2.2               I:C ratio       0000 5   0500 5   0800 5   1200 4   1800 4          Sensitivity       0000  20   0500 30   0800 20   1200 15   1800 15               EDUCATION / INSTRUCTIONS: BG monitoring instructions: Patient is instructed to check his blood sugars 3 times a day, before meals . Call [facility] if: BG persistently < 70  I reviewed the Rule of 15 for the treatment of hypoglycemia in detail with the patient.  Literature supplied.   2) Diabetic complications:  Eye: Does not have known diabetic retinopathy.  Neuro/ Feet: Does not have known diabetic peripheral neuropathy. Renal: Patient does not have known baseline CKD. He is not on an ACEI/ARB at present.     3) Hypothyroidism :   - Pt is biochemically euthyroid  - Continue levothyroxine as below   Medication  Levothyroxine 25 mcg daily    4) Dyslipidemia :   - LDL is elevated, will recommend low fat diet. HDL low, will recommend exercise  - NO indication for statin therapy until age 28    Signed electronically by: Mack Guise, MD  [facility]  [facility] [address] [place], [place] [address] Phone: [phone] FAX: [phone]   CC: Pcp, No No address on file Phone: None  Fax: None    Return to Endocrinology clinic as below: Future Appointments  Date Time Provider [place]  12/04/2021  1:00 PM Derak Schurman, Melanie Crazier, MD [id number] None

## 2021-09-04 NOTE — Patient Instructions (Addendum)
Start Metformin 1 tablet daily with Breakfast for 1 week, then increase to 1 tablet with Breakfast and 1 tablet with Supper for  1 week, then increase to 2 tablets with Breakfast  and 1 tablet with Supper for another 1 week , then finally 2 Tablets with Breakfast and 2 tablets with Supper.    Basal rate       0000 1.9  u/hr   0200 1.8    0500 1.5    0800 1.35    1200 1.90   1800 2.2               I:C ratio       0000 5   0500 5   0800 5   1200 4   1800 4          Sensitivity       0000  20   0500 30   0800 20   1200 15   1800 15

## 2021-09-05 MED ORDER — LEVOTHYROXINE SODIUM 25 MCG PO TABS: 25.0000 ug | ORAL_TABLET | Freq: Every day | ORAL | 3 refills | Status: DC

## 2021-09-22 ENCOUNTER — Encounter: Payer: Self-pay | Admitting: Internal Medicine

## 2021-12-04 ENCOUNTER — Encounter: Payer: Self-pay | Admitting: Internal Medicine

## 2021-12-04 ENCOUNTER — Other Ambulatory Visit: Payer: Self-pay

## 2021-12-04 ENCOUNTER — Ambulatory Visit (INDEPENDENT_AMBULATORY_CARE_PROVIDER_SITE_OTHER): Payer: BC Managed Care – PPO | Admitting: Internal Medicine

## 2021-12-04 VITALS — BP 120/82 | HR 83 | Ht 68.0 in | Wt 202.3 lb

## 2021-12-04 DIAGNOSIS — E785 Hyperlipidemia, unspecified: Secondary | ICD-10-CM

## 2021-12-04 DIAGNOSIS — E1065 Type 1 diabetes mellitus with hyperglycemia: Secondary | ICD-10-CM | POA: Diagnosis not present

## 2021-12-04 DIAGNOSIS — E039 Hypothyroidism, unspecified: Secondary | ICD-10-CM | POA: Diagnosis not present

## 2021-12-04 LAB — POCT GLYCOSYLATED HEMOGLOBIN (HGB A1C): Hemoglobin A1C: 7.6 % — AB (ref 4.0–5.6)

## 2021-12-04 NOTE — Progress Notes (Signed)
Name: Stephen Thompson  MRN/ DOB: [id number], [birth year]   Age/ Sex: 20 y.o., male    PCP: Pcp, No   Reason for Endocrinology Evaluation: Type 1 Diabetes Mellitus     Date of Initial Endocrinology Visit: 09/04/2021    PATIENT IDENTIFIER: Stephen Thompson is a 20 y.o. male with a past medical history of T1DM, celiac disease, hypothyroid. The patient presented for initial endocrinology clinic visit on 09/04/2021 for consultative assistance with his diabetes management.       DIABETIC HISTORY:  Stephen Thompson was diagnosed with DM at age 5, he was on the Medtronic pump followed by the tandem. His hemoglobin A1c has ranged from 6.8% in 2021, peaking at 11.0% in 2017  On his initial visit to our clinic he had an A1c of 8.3%, we adjusted his pump settings and attempted to start metformin XR but he felt this was a drastic change   Studies computer science   SUBJECTIVE:   During the last visit (09/04/2021): A1c 8.3%, we adjusted pump settings, started metformin     Today (12/04/21): Stephen Thompson is here for follow-up on diabetes management.  Checks his blood sugars multiple times daily, through CGM.  We were unable to download his pump today   He denies nausea, vomiting or diarrhea   This patient with type 1 diabetes is treated with Tandem  (insulin pump). During the visit the pump basal and bolus doses were reviewed including carb/insulin rations and supplemental doses. The clinical list was updated. The glucose meter download was reviewed in detail to determine if the current pump settings are providing the best glycemic control without excessive hypoglycemia.  Pump and meter download:    Pump   Tandem  Settings   Insulin type   Novolog   Basal rate       0000 1.9 u/h    0200 1.8   0500 1.5 u/h    0800 1.35 u/h    1200 1.90   1800 2.2               I:C ratio       0000 5   0500 5   0800 5   1200 4   1800 4          Sensitivity       0000  20   0500 30   0800  20   1200 15   1800 15  Goal       0000  120             Type & Model of Pump: Tandem  Insulin Type: Currently using Novolog .      THYROID HISTORY: He has been noted with an elevated TSH at 7.061 you IU/mL during labs in 2012.  Anti-TPO antibodies negative   NO FH of autoimmune disease   CELIAC DISEASE: Pt on gluten free diet     HOME DIABETES REGIMEN: Novolog    Statin: no ACE-I/ARB: no Prior Diabetic Education: yes     DIABETIC COMPLICATIONS: Microvascular complications:   Denies: CKD,  retinopathy, neuropathy  Last eye exam: Completed 2021  Macrovascular complications:   Denies: CAD, PVD, CVA   PAST HISTORY: Past Medical History:  Past Medical History:  Diagnosis Date   Celiac disease 02/24/2012   Recently diagnosed   Diabetes mellitus    Type 1 diabetes mellitus not at goal [facility]) 10/31/2011   Past Surgical History:  Past Surgical History:  Procedure Laterality Date  ESOPHAGOGASTRODUODENOSCOPY  12/21/2011   Procedure: ESOPHAGOGASTRODUODENOSCOPY (EGD);  Surgeon: Oletha Blend, MD;  Location: [facility];  Service: Gastroenterology;  Laterality: N/A;   NO PAST SURGERIES      Social History:  reports that he has never smoked. He has never used smokeless tobacco. He reports that he does not drink alcohol and does not use drugs. Family History:  Family History  Problem Relation Age of Onset   Diabetes Paternal Grandfather        type 2   Hyperthyroidism Maternal Grandmother    Thyroid disease Maternal Grandmother    Multiple sclerosis Maternal Grandfather    Diabetes Maternal Grandfather        type 2   Heart failure Cousin    Celiac disease Neg Hx      HOME MEDICATIONS: Allergies as of 12/04/2021       Reactions   Gluten Meal    Wheat    Has celiac         Medication List        Accurate as of December 04, 2021  1:15 PM. If you have any questions, ask your nurse or doctor.          STOP taking these medications     Glucagon 3 MG/DOSE Powd Commonly known as: Baqsimi Two Pack Stopped by: Dorita Sciara, MD       TAKE these medications    Accu-Chek FastClix Lancets Misc 1 Container by Does not apply route See admin instructions. Check blood sugar 10 times daily Dispense 300 lancets per month.   Accu-Chek Guide test strip Generic drug: glucose blood USE AS INSTRUCTED FOR 6 CHECKS PER DAY PLUS PER PROTOCOL FOR HYPER/HYPOGLYCEMIA   Dexcom G6 Sensor Misc Inject 1 applicator into the skin as directed. Change sensor every 30 days.   Dexcom G6 Transmitter Misc Inject 1 kit into the skin as directed. Reuse transmitter 8 times.   GlucaGen HypoKit 1 MG Solr injection Generic drug: glucagon GLUCAGEN HYPOKIT DOUBLE PACK. Inject 1 mg into anterior thigh muscle if unconscious, unable to swallow, unresponsive and/or has a seizure   Glucagon Emergency 1 MG Kit PLEASE SEE ATTACHED FOR DETAILED DIRECTIONS   insulin aspart 100 UNIT/ML injection Commonly known as: NovoLOG Max daily 150 units per pump   Insulin Pen Needle 32G X 4 MM Misc Inject 1 each into the skin as needed. Reported on 05/08/2016   levothyroxine 25 MCG tablet Commonly known as: SYNTHROID Take 1 tablet (25 mcg total) by mouth daily before breakfast.   metFORMIN 500 MG 24 hr tablet Commonly known as: GLUCOPHAGE-XR Take 2 tablets (1,000 mg total) by mouth in the morning and at bedtime.         ALLERGIES: Allergies  Allergen Reactions   Gluten Meal    Wheat     Has celiac      REVIEW OF SYSTEMS: A comprehensive ROS was conducted with the patient and is negative except as per HPI and below:  Review of Systems  Gastrointestinal:  Negative for nausea and vomiting.  Neurological:  Negative for tingling.     OBJECTIVE:   VITAL SIGNS: BP 120/82 (BP Location: Left Arm, Patient Position: Sitting, Cuff Size: Small)   Pulse 83   Ht 5' 8" (1.727 m)   Wt 202 lb 4.8 oz (91.8 kg)   SpO2 98%   BMI 30.76 kg/m     PHYSICAL EXAM:  General: Pt appears well and is in NAD  Neck: General: Supple without  adenopathy or carotid bruits. Thyroid: Thyroid size normal.  No goiter or nodules appreciated.   Lungs: Clear with good BS bilat with no rales, rhonchi, or wheezes  Heart: RRR with normal S1 and S2 and no gallops; no murmurs; no rub  Abdomen: Normoactive bowel sounds, soft, nontender, without masses or organomegaly palpable  Extremities:  Lower extremities - No pretibial edema. No lesions.  Skin: Normal texture and temperature to palpation. No rash noted. No Acanthosis nigricans/skin tags. No lipohypertrophy.  Neuro: MS is good with appropriate affect, pt is alert and Ox3    DM foot exam: 09/04/2021  The skin of the feet is intact without sores or ulcerations. The pedal pulses are 2+ on right and 2+ on left. The sensation is intact to a screening 5.07, 10 gram monofilament bilaterally   DATA REVIEWED:  Lab Results  Component Value Date   HGBA1C 7.6 (A) 12/04/2021   HGBA1C 8.3 (A) 09/04/2021   HGBA1C 7.4 (A) 05/29/2021   Results for Stephen Thompson, Stephen Thompson (MRN [id number]) as of 09/05/2021 12:37  Ref. Range 09/04/2021 14:09  Sodium Latest Ref Range: 135 - 145 mEq/L 138  Potassium Latest Ref Range: 3.5 - 5.1 mEq/L 4.6  Chloride Latest Ref Range: 96 - 112 mEq/L 103  CO2 Latest Ref Range: 19 - 32 mEq/L 26  Glucose Latest Ref Range: 70 - 99 mg/dL 163 (H)  BUN Latest Ref Range: 6 - 23 mg/dL 14  Creatinine Latest Ref Range: 0.40 - 1.50 mg/dL 0.91  Calcium Latest Ref Range: 8.4 - 10.5 mg/dL 9.9  GFR Latest Ref Range: >60.00 mL/min 120.39  Total CHOL/HDL Ratio Unknown 5  Cholesterol Latest Ref Range: 0 - 200 mg/dL 173  HDL Cholesterol Latest Ref Range: >39.00 mg/dL 37.80 (L)  LDL (calc) Latest Ref Range: 0 - 99 mg/dL 120 (H)  MICROALB/CREAT RATIO Latest Ref Range: 0.0 - 30.0 mg/g 0.5  NonHDL Unknown 135.14  Triglycerides Latest Ref Range: 0.0 - 149.0 mg/dL 75.0  VLDL Latest Ref Range: 0.0 - 40.0  mg/dL 15.0  TSH Latest Ref Range: 0.35 - 5.50 uIU/mL 1.73  Creatinine,U Latest Units: mg/dL 170.9  Microalb, Ur Latest Ref Range: 0.0 - 1.9 mg/dL 0.8    ASSESSMENT / PLAN / RECOMMENDATIONS:   1) Type 1 Diabetes Mellitus, Sub-Optimally controlled, Without complications - Most recent A1c of 7.6  %. Goal A1c < 7.0 %.     - A1c trending down  - He declined Add-on therapy, he felt adding Metformin was pretty drastic for his situation  - Unable to download his T- slim, last upload was 10/18th. Pt advised to upload to the cloud at least prior to his visit.  - NO changes today , he admits to forgetting to bolus at times hence hyperglycemia   MEDICATIONS: Novolog     Basal rate       0000 1.9  u/hr   0200 1.8    0500 1.5    0800 1.35    1200 1.90   1800 2.2               I:C ratio       0000 5   0500 5   0800 5   1200 4   1800 4          Sensitivity       0000  20   0500 30   0800 20   1200 15   1800 15  EDUCATION / INSTRUCTIONS: BG monitoring instructions: Patient is instructed to check his blood sugars 3 times a day, before meals . Call [facility] clinic if: BG persistently < 70  I reviewed the Rule of 15 for the treatment of hypoglycemia in detail with the patient. Literature supplied.   2) Diabetic complications:  Eye: Does not have known diabetic retinopathy.  Neuro/ Feet: Does not have known diabetic peripheral neuropathy. Renal: Patient does not have known baseline CKD. He is not on an ACEI/ARB at present.     3) Hypothyroidism :   - Pt is biochemically euthyroid  - Continue levothyroxine as below   Medication  Continue Levothyroxine 25 mcg daily    4) Dyslipidemia :   - LDL is elevated - No indication for statin therapy until age 71    F/U in 6 months   Signed electronically by: Mack Guise, MD  [facility]  [facility] [address] [place], [place]  [address] Phone: [phone] FAX: [phone]   CC: Pcp, No No address on file Phone: None  Fax: None    Return to Endocrinology clinic as below: No future appointments.

## 2022-01-09 ENCOUNTER — Other Ambulatory Visit (INDEPENDENT_AMBULATORY_CARE_PROVIDER_SITE_OTHER): Payer: Self-pay | Admitting: Pediatric Endocrinology

## 2022-05-29 ENCOUNTER — Other Ambulatory Visit (INDEPENDENT_AMBULATORY_CARE_PROVIDER_SITE_OTHER): Payer: Self-pay | Admitting: Pediatric Endocrinology

## 2022-05-29 ENCOUNTER — Telehealth (INDEPENDENT_AMBULATORY_CARE_PROVIDER_SITE_OTHER): Payer: Self-pay

## 2022-05-29 NOTE — Telephone Encounter (Signed)
Mychart message sent to pt to have them make an appointment since it has been a year since we saw pt

## 2022-06-11 ENCOUNTER — Ambulatory Visit: Payer: BC Managed Care – PPO | Admitting: Internal Medicine

## 2022-06-11 ENCOUNTER — Encounter: Payer: Self-pay | Admitting: Internal Medicine

## 2022-06-11 VITALS — BP 124/70 | HR 80 | Ht 68.0 in | Wt 206.0 lb

## 2022-06-11 DIAGNOSIS — E1065 Type 1 diabetes mellitus with hyperglycemia: Secondary | ICD-10-CM

## 2022-06-11 LAB — POCT GLYCOSYLATED HEMOGLOBIN (HGB A1C): Hemoglobin A1C: 7.3 % — AB (ref 4.0–5.6)

## 2022-06-11 NOTE — Progress Notes (Signed)
Name: Stephen Thompson  MRN/ DOB: [id number], 2001/09/30   Age/ Sex: 21 y.o., male    PCP: Gaynelle Arabian, MD   Reason for Endocrinology Evaluation: Type 1 Diabetes Mellitus     Date of Initial Endocrinology Visit: 09/04/2021    PATIENT IDENTIFIER: Stephen Thompson is a 21 y.o. male with a past medical history of T1DM, celiac disease, hypothyroid. The patient presented for initial endocrinology clinic visit on 09/04/2021 for consultative assistance with his diabetes management.       DIABETIC HISTORY:  Mr. Stroot was diagnosed with DM at age 35, he was on the Medtronic pump followed by the tandem. His hemoglobin A1c has ranged from 6.8% in 2021, peaking at 11.0% in 2017  On his initial visit to our clinic he had an A1c of 8.3%, we adjusted his pump settings and attempted to start metformin XR but he felt this was a drastic change  and didn't take it   Studies computer science   SUBJECTIVE:   During the last visit (12/04/2021): A1c 7.6%, we adjusted pump settings   Today (06/11/22): Mr. Kneisel is here for follow-up on diabetes management.  Checks his blood sugars multiple times daily, through CGM.  We were unable to download his pump today   He denies nausea, vomiting or diarrhea   This patient with type 1 diabetes is treated with Tandem  (insulin pump). During the visit the pump basal and bolus doses were reviewed including carb/insulin rations and supplemental doses. The clinical list was updated. The glucose meter download was reviewed in detail to determine if the current pump settings are providing the best glycemic control without excessive hypoglycemia.  Pump and meter download:    Pump   Tandem  Settings   Insulin type   Novolog   Basal rate       0000 1.9 u/h    0200 1.8   0500 1.5 u/h    0800 1.35 u/h    1200 1.90   1800 2.2               I:C ratio       0000 5   0200 5   0500 5   0800 5   1200 4   1800 4          Sensitivity       0000  20    0200 20   0500 30   0800 20   1200 15   1800 15  Goal       0000  120             Type & Model of Pump: Tandem  Insulin Type: Currently using Novolog .   PUMP STATISTICS: Average BG: 179 Average Daily Carbs (g): 54  Average Total Daily Insulin: 84.93  Average Daily Basal: 51.69 (61 %) Average Daily Bolus: 20.45 (24 %)     THYROID HISTORY: He has been noted with an elevated TSH at 7.061 you IU/mL during labs in 2012.  Anti-TPO antibodies negative   NO FH of autoimmune disease   CELIAC DISEASE: Pt on gluten free diet     HOME DIABETES REGIMEN: Novolog    Statin: no ACE-I/ARB: no Prior Diabetic Education: yes     DIABETIC COMPLICATIONS: Microvascular complications:   Denies: CKD,  retinopathy, neuropathy  Last eye exam: Completed 2021  Macrovascular complications:   Denies: CAD, PVD, CVA   PAST HISTORY: Past Medical History:  Past Medical History:  Diagnosis  Date   Celiac disease 02/24/2012   Recently diagnosed   Diabetes mellitus    Type 1 diabetes mellitus not at goal [facility]) 10/31/2011   Past Surgical History:  Past Surgical History:  Procedure Laterality Date   ESOPHAGOGASTRODUODENOSCOPY  12/21/2011   Procedure: ESOPHAGOGASTRODUODENOSCOPY (EGD);  Surgeon: Oletha Blend, MD;  Location: [place];  Service: Gastroenterology;  Laterality: N/A;   NO PAST SURGERIES      Social History:  reports that he has never smoked. He has never used smokeless tobacco. He reports that he does not drink alcohol and does not use drugs. Family History:  Family History  Problem Relation Age of Onset   Diabetes Paternal Grandfather        type 2   Hyperthyroidism Maternal Grandmother    Thyroid disease Maternal Grandmother    Multiple sclerosis Maternal Grandfather    Diabetes Maternal Grandfather        type 2   Heart failure Cousin    Celiac disease Neg Hx      HOME MEDICATIONS: Allergies as of 06/11/2022       Reactions   Gluten Meal     Wheat    Has celiac         Medication List        Accurate as of June 11, 2022  1:29 PM. If you have any questions, ask your nurse or doctor.          Accu-Chek FastClix Lancets Misc 1 Container by Does not apply route See admin instructions. Check blood sugar 10 times daily Dispense 300 lancets per month.   Accu-Chek Guide test strip Generic drug: glucose blood USE AS INSTRUCTED FOR 6 CHECKS PER DAY PLUS PER PROTOCOL FOR HYPER/HYPOGLYCEMIA   Dexcom G6 Sensor Misc Inject 1 applicator into the skin as directed. Change sensor every 30 days.   Dexcom G6 Transmitter Misc Inject 1 kit into the skin as directed. Reuse transmitter 8 times.   GlucaGen HypoKit 1 MG Solr injection Generic drug: glucagon GLUCAGEN HYPOKIT DOUBLE PACK. Inject 1 mg into anterior thigh muscle if unconscious, unable to swallow, unresponsive and/or has a seizure   Glucagon Emergency 1 MG Kit   Insulin Pen Needle 32G X 4 MM Misc Inject 1 each into the skin as needed. Reported on 05/08/2016   levothyroxine 25 MCG tablet Commonly known as: SYNTHROID Take 1 tablet (25 mcg total) by mouth daily before breakfast.   NovoLOG 100 UNIT/ML injection Generic drug: insulin aspart USE 300 UNITS IN INSULIN PUMP EVERY 48 HOURS AS DIRECTED         ALLERGIES: Allergies  Allergen Reactions   Gluten Meal    Wheat     Has celiac         OBJECTIVE:   VITAL SIGNS: BP 124/70 (BP Location: Left Arm, Patient Position: Sitting, Cuff Size: Small)   Pulse 80   Ht 5' 8"  (1.727 m)   Wt 206 lb (93.4 kg)   SpO2 98%   BMI 31.32 kg/m    PHYSICAL EXAM:  General: Pt appears well and is in NAD  Neck: General: Supple without adenopathy or carotid bruits. Thyroid: Thyroid size normal.  No goiter or nodules appreciated.   Lungs: Clear with good BS bilat with no rales, rhonchi, or wheezes  Heart: RRR with normal S1 and S2 and no gallops; no murmurs; no rub  Abdomen: Normoactive bowel sounds, soft, nontender,  without masses or organomegaly palpable  Extremities:  Lower extremities - No pretibial  edema. No lesions.  Skin: Normal texture and temperature to palpation. No rash noted. No Acanthosis nigricans/skin tags. No lipohypertrophy.  Neuro: MS is good with appropriate affect, pt is alert and Ox3    DM foot exam: 09/04/2021  The skin of the feet is intact without sores or ulcerations. The pedal pulses are 2+ on right and 2+ on left. The sensation is intact to a screening 5.07, 10 gram monofilament bilaterally   DATA REVIEWED:  Lab Results  Component Value Date   HGBA1C 7.3 (A) 06/11/2022   HGBA1C 7.6 (A) 12/04/2021   HGBA1C 8.3 (A) 09/04/2021   Results for LAZARUS, SUDBURY (MRN [id number]) as of 09/05/2021 12:37  Ref. Range 09/04/2021 14:09  Sodium Latest Ref Range: 135 - 145 mEq/L 138  Potassium Latest Ref Range: 3.5 - 5.1 mEq/L 4.67  Chloride Latest Ref Range: 96 - 112 mEq/L 103  CO2 Latest Ref Range: 19 - 32 mEq/L 26  Glucose Latest Ref Range: 70 - 99 mg/dL 163 (H)  BUN Latest Ref Range: 6 - 23 mg/dL 14  Creatinine Latest Ref Range: 0.40 - 1.50 mg/dL 0.91  Calcium Latest Ref Range: 8.4 - 10.5 mg/dL 9.9  GFR Latest Ref Range: >60.00 mL/min 120.39  Total CHOL/HDL Ratio Unknown 5  Cholesterol Latest Ref Range: 0 - 200 mg/dL 173  HDL Cholesterol Latest Ref Range: >39.00 mg/dL 37.80 (L)  LDL (calc) Latest Ref Range: 0 - 99 mg/dL 120 (H)  MICROALB/CREAT RATIO Latest Ref Range: 0.0 - 30.0 mg/g 0.5  NonHDL Unknown 135.14  Triglycerides Latest Ref Range: 0.0 - 149.0 mg/dL 75.0  VLDL Latest Ref Range: 0.0 - 40.0 mg/dL 15.0  TSH Latest Ref Range: 0.35 - 5.50 uIU/mL 1.73  Creatinine,U Latest Units: mg/dL 170.9  Microalb, Ur Latest Ref Range: 0.0 - 1.9 mg/dL 0.8    ASSESSMENT / PLAN / RECOMMENDATIONS:   1) Type 1 Diabetes Mellitus, Sub-Optimally controlled, Without complications - Most recent A1c of 7.6  %. Goal A1c < 7.0 %.     - A1c trending down  - He had declined Add-on  therapy, he felt adding Metformin was pretty drastic for his situation  - Pt with glycemic excursions, he doesn't enter CHO due to hypoglycemia, will adjust I: C ratio and SF  - He also has been noted with hypoglycemia without a bolus, will reduce basal rate  - Pt advised to check glucose immediately before exercise and eat a protein bar if BG < 150 mg/dL  - He was also advised to consume between 60-90 grams of CHO per meal    MEDICATIONS: Novolog     Basal rate       0000 1.8  u/hr   0200 1.7   0500 1.4   0800 1.40   1200 1.8   1800 2.1              I:C ratio       0000 10   0500 10   0800 10   1200 10   1800 10          Sensitivity       0000  25   0200 25   0500 25   0800 25   1200 20   1800 20               EDUCATION / INSTRUCTIONS: BG monitoring instructions: Patient is instructed to check his blood sugars 3 times a day, before meals . Call [facility] if: BG  persistently < 70  I reviewed the Rule of 15 for the treatment of hypoglycemia in detail with the patient. Literature supplied.   2) Diabetic complications:  Eye: Does not have known diabetic retinopathy. Pt urged to have an eye exam this year  Neuro/ Feet: Does not have known diabetic peripheral neuropathy. Renal: Patient does not have known baseline CKD. He is not on an ACEI/ARB at present.     3) Hypothyroidism :   - Pt is biochemically euthyroid  - Continue levothyroxine as below   Medication  Continue Levothyroxine 25 mcg daily    4) Dyslipidemia :   - LDL is elevated in the past - No indication for statin therapy until age 5    F/U in 6 months   Signed electronically by: Mack Guise, MD  [facility] Endocrinology  [facility] [address] [place], [place] [address] Phone: [phone] FAX: [phone]   CC: Gaynelle Arabian, MD [address] [place] [address] Phone: [phone]  Fax:  [phone]    Return to Endocrinology clinic as below: No future appointments.

## 2022-06-11 NOTE — Patient Instructions (Addendum)
    Basal rate       0000 1.8  u/hr   0200 1.7   0500 1.4   0800 1.40   1200 1.8   1800 2.1              I:C ratio       0000 10   0500 10   0800 10   1200 10   1800 10          Sensitivity       0000  25   0200 25   0500 25   0800 25   1200 20   1800 20

## 2022-06-12 ENCOUNTER — Encounter: Payer: Self-pay | Admitting: Internal Medicine

## 2022-07-11 ENCOUNTER — Other Ambulatory Visit (INDEPENDENT_AMBULATORY_CARE_PROVIDER_SITE_OTHER): Payer: Self-pay | Admitting: Pediatric Endocrinology

## 2022-09-05 ENCOUNTER — Other Ambulatory Visit (INDEPENDENT_AMBULATORY_CARE_PROVIDER_SITE_OTHER): Payer: Self-pay | Admitting: Pediatric Endocrinology

## 2022-09-10 ENCOUNTER — Other Ambulatory Visit: Payer: Self-pay

## 2022-09-10 MED ORDER — INSULIN ASPART 100 UNIT/ML IJ SOLN: INTRAMUSCULAR | 1 refills | Status: DC

## 2022-12-10 ENCOUNTER — Encounter: Payer: Self-pay | Admitting: Internal Medicine

## 2022-12-10 ENCOUNTER — Ambulatory Visit: Payer: BC Managed Care – PPO | Admitting: Internal Medicine

## 2022-12-10 VITALS — BP 116/70 | HR 84 | Ht 68.0 in | Wt 212.0 lb

## 2022-12-10 DIAGNOSIS — Z4681 Encounter for fitting and adjustment of insulin pump: Secondary | ICD-10-CM

## 2022-12-10 DIAGNOSIS — E063 Autoimmune thyroiditis: Secondary | ICD-10-CM

## 2022-12-10 DIAGNOSIS — E1065 Type 1 diabetes mellitus with hyperglycemia: Secondary | ICD-10-CM

## 2022-12-10 LAB — BASIC METABOLIC PANEL
BUN: 15 mg/dL (ref 6–23)
CO2: 26 mEq/L (ref 19–32)
Calcium: 9.9 mg/dL (ref 8.4–10.5)
Chloride: 103 mEq/L (ref 96–112)
Creatinine, Ser: 0.96 mg/dL (ref 0.40–1.50)
GFR: 111.91 mL/min (ref >60.00)
Glucose, Bld: 160 mg/dL — ABNORMAL HIGH (ref 70–99)
Potassium: 4.1 mEq/L (ref 3.5–5.1)
Sodium: 138 mEq/L (ref 135–145)

## 2022-12-10 LAB — LIPID PANEL
Cholesterol: 187 mg/dL (ref 0–200)
HDL: 43 mg/dL (ref >39.00)
LDL Cholesterol: 122 mg/dL — ABNORMAL HIGH (ref 0–99)
NonHDL: 144.24
Total CHOL/HDL Ratio: 4
Triglycerides: 111 mg/dL (ref 0.0–149.0)
VLDL: 22.2 mg/dL (ref 0.0–40.0)

## 2022-12-10 LAB — POCT GLYCOSYLATED HEMOGLOBIN (HGB A1C): Hemoglobin A1C: 7.5 % — AB (ref 4.0–5.6)

## 2022-12-10 LAB — MICROALBUMIN / CREATININE URINE RATIO
Creatinine,U: 87.5 mg/dL
Microalb Creat Ratio: 0.8 mg/g (ref 0.0–30.0)
Microalb, Ur: <0.7 mg/dL (ref 0.0–1.9)

## 2022-12-10 LAB — TSH: TSH: 2.05 u[IU]/mL (ref 0.35–5.50)

## 2022-12-10 NOTE — Progress Notes (Unsigned)
Name: Stephen Thompson  MRN/ DOB: [id number], 01-May-2001   Age/ Sex: 21 y.o., male    PCP: Gaynelle Arabian, MD   Reason for Endocrinology Evaluation: Type 1 Diabetes Mellitus     Date of Initial Endocrinology Visit: 09/04/2021    PATIENT IDENTIFIER: [name] is a 21 y.o. male with a past medical history of T1DM, celiac disease, hypothyroid. The patient presented for initial endocrinology clinic visit on 09/04/2021 for consultative assistance with his diabetes management.       DIABETIC HISTORY:  Stephen Thompson was diagnosed with DM at age 89, Stephen Thompson was on the Medtronic pump followed by the tandem. His hemoglobin A1c has ranged from 6.8% in 2021, peaking at 11.0% in 2017  On his initial visit to our clinic Stephen Thompson had an A1c of 8.3%, we adjusted his pump settings and attempted to start metformin XR but Stephen Thompson felt this was a drastic change  and didn't take it   Studies computer science     THYROID HISTORY: Stephen Thompson has been noted with an elevated TSH at 7.061 you IU/mL during labs in 2012.  Anti-TPO antibodies negative   No FH of autoimmune disease   CELIAC DISEASE: Stephen Thompson on gluten free diet       SUBJECTIVE:   During the last visit (06/11/2022): A1c 7.6%    Today (12/10/22): Stephen Thompson is here for follow-up on diabetes management.  Checks his blood sugars multiple times daily, through CGM.  We were unable to download his pump today    This patient with type 1 diabetes is treated with Tandem  (insulin pump). During the visit the pump basal and bolus doses were reviewed including carb/insulin rations and supplemental doses. The clinical list was updated. The glucose meter download was reviewed in detail to determine if the current pump settings are providing the best glycemic control without excessive hypoglycemia.  Upon reviewed his download,  his I: c ratio  was changed from 10 to 8 and FS from 25 to 30 at 5 am , Stephen Thompson denies any self adjustments.  Denies nausea, vomiting or  diarrhea  Graduating from computer science at [employer]    Stephen Thompson has not been consistent with LT-4 replacement   Pump and meter download:     Basal rate   I:C ratio  SF   0000 1.8  u/hr 8 25   0200 1._0 0500 1._1 0800 1.40 8 25   1200 1._2 1800 2._3 Type & Model of Pump: Tandem  Insulin Type: Currently using Novolog .   PUMP STATISTICS: Average BG: 177 Average Daily Carbs (g): 133  Average Total Daily Insulin: 87.68  Average Daily Basal: 49.68 (57 %) Average Daily Bolus: 38 (43 %)   CONTINUOUS GLUCOSE MONITORING RECORD INTERPRETATION    Dates of Recording: 12/5-12/18/2023  Sensor description:dexcom  Results statistics:   CGM use % of time 92  Average and SD 177/53  Time in range      53  %  % Time Above 180 26  % Time above 250 10  % Time Below target 1   Glycemic patterns summary: Bg's trend down to optimal overnight, hyperglycemia noted during the day   Hyperglycemic episodes  postprandial   Hypoglycemic episodes occurred rare and without a pattern   Overnight periods: trends down to Optimal  HOME DIABETES REGIMEN: Novolog    Statin: no ACE-I/ARB: no Prior Diabetic Education: yes     DIABETIC COMPLICATIONS: Microvascular complications:   Denies: CKD,  retinopathy, neuropathy  Last eye exam: Completed 2023  Macrovascular complications:   Denies: CAD, PVD, CVA   PAST HISTORY: Past Medical History:  Past Medical History:  Diagnosis Date   Celiac disease 02/24/2012   Recently diagnosed   Diabetes mellitus    Type 1 diabetes mellitus not at goal [facility]) 10/31/2011   Past Surgical History:  Past Surgical History:  Procedure Laterality Date   ESOPHAGOGASTRODUODENOSCOPY  12/21/2011   Procedure: ESOPHAGOGASTRODUODENOSCOPY (EGD);  Surgeon: Oletha Blend, MD;  Location: [place];  Service: Gastroenterology;  Laterality: N/A;   NO PAST SURGERIES      Social History:  reports that Stephen Thompson has never  smoked. Stephen Thompson has never used smokeless tobacco. Stephen Thompson reports that Stephen Thompson does not drink alcohol and does not use drugs. Family History:  Family History  Problem Relation Age of Onset   Diabetes Paternal Grandfather        type 2   Hyperthyroidism Maternal Grandmother    Thyroid disease Maternal Grandmother    Multiple sclerosis Maternal Grandfather    Diabetes Maternal Grandfather        type 2   Heart failure Cousin    Celiac disease Neg Hx      HOME MEDICATIONS: Allergies as of 12/10/2022       Reactions   Gluten Meal    Wheat    Has celiac         Medication List        Accurate as of December 10, 2022  1:07 PM. If you have any questions, ask your nurse or doctor.          Accu-Chek FastClix Lancets Misc 1 Container by Does not apply route See admin instructions. Check blood sugar 10 times daily Dispense 300 lancets per month.   Accu-Chek Guide test strip Generic drug: glucose blood USE AS INSTRUCTED FOR 6 CHECKS PER DAY PLUS PER PROTOCOL FOR HYPER/HYPOGLYCEMIA   Dexcom G6 Sensor Misc Inject 1 applicator into the skin as directed. Change sensor every 30 days.   Dexcom G6 Transmitter Misc Inject 1 kit into the skin as directed. Reuse transmitter 8 times.   GlucaGen HypoKit 1 MG Solr injection Generic drug: glucagon GLUCAGEN HYPOKIT DOUBLE PACK. Inject 1 mg into anterior thigh muscle if unconscious, unable to swallow, unresponsive and/or has a seizure   Glucagon Emergency 1 MG Kit   insulin aspart 100 UNIT/ML injection Commonly known as: NovoLOG USE 300 UNITS IN INSULIN PUMP EVERY 48 HOURS AS DIRECTED   Insulin Pen Needle 32G X 4 MM Misc Inject 1 each into the skin as needed. Reported on 05/08/2016   levothyroxine 25 MCG tablet Commonly known as: SYNTHROID Take 1 tablet (25 mcg total) by mouth daily before breakfast.         ALLERGIES: Allergies  Allergen Reactions   Gluten Meal    Wheat     Has celiac         OBJECTIVE:   VITAL SIGNS: BP  116/70 (BP Location: Left Arm, Patient Position: Sitting, Cuff Size: Large)   Pulse 84   Ht _0  (1.727 m)   Wt 212 lb (96.2 kg)   SpO2 99%   BMI 32.23 kg/m    PHYSICAL EXAM:  General: Stephen Thompson appears well and is in NAD  Neck: General: Supple without adenopathy or carotid bruits.  Thyroid: Thyroid size normal.  No goiter or nodules appreciated.   Lungs: Clear with good BS bilat   Heart: RRR   Abdomen: soft, nontender  Extremities:  Lower extremities - No pretibial edema.  Neuro: MS is good with appropriate affect, Stephen Thompson is alert and Ox3    DM foot exam:12/10/2022  The skin of the feet is intact without sores or ulcerations. The pedal pulses are 2+ on right and 2+ on left. The sensation is intact to a screening 5.07, 10 gram monofilament bilaterally   DATA REVIEWED:  Lab Results  Component Value Date   HGBA1C 7.5 (A) 12/10/2022   HGBA1C 7.3 (A) 06/11/2022   HGBA1C 7.6 (A) 12/04/2021  *****   ASSESSMENT / PLAN / RECOMMENDATIONS:   1) Type 1 Diabetes Mellitus, Sub-Optimally controlled, Without complications - Most recent A1c of 7.5  %. Goal A1c < 7.0 %.     - A1c stable but remains above goal , we discussed A1c goal 6.5-7.0% - Stephen Thompson had declined Add-on therapy - Stephen Thompson with glycemic excursions, Stephen Thompson continues to skip on CHO entry at times  - Stephen Thompson has been noted with over-correction, we discussed rule of 15 for hypoglycemia  - I will adjust I: c ratio as Stephen Thompson has been noted with postprandial hyperglycemia even with a proper bolus, no change to basal rate nor SF   MEDICATIONS: Novolog       Basal rate   I:C ratio  SF   0000 1.8  u/hr 7 25   0200 1._0 0500 1._1 0800 1.40 7 25   1200 1._2 1800 2._3 EDUCATION / INSTRUCTIONS: BG monitoring instructions: Patient is instructed to check his blood sugars 3 times a day, before meals . Call [facility] if: BG persistently < 70  I reviewed the Rule of 15 for the treatment of  hypoglycemia in detail with the patient. Literature supplied.   2) Diabetic complications:  Eye: Does not have known diabetic retinopathy.  Neuro/ Feet: Does not have known diabetic peripheral neuropathy. Renal: Patient does not have known baseline CKD. Stephen Thompson is not on an ACEI/ARB at present.     3) Hypothyroidism :  - Stephen Thompson with imperfect adherence to levothyroxine  - Stephen Thompson advised to double up on levothyroxine should Stephen Thompson miss a dose one day   Medication  Continue Levothyroxine 25 mcg daily    4) Dyslipidemia :   - LDL I*** - No indication for statin therapy until age 21    F/U in 6 months   Signed electronically by: Mack Guise, MD  [facility] Endocrinology  [facility] [address] [place], [place] [address] Phone: [phone] FAX: [phone]   CC: Gaynelle Arabian, MD [address] [place] [address] Phone: [phone]  Fax: [phone]    Return to Endocrinology clinic as below: No future appointments.

## 2022-12-10 NOTE — Patient Instructions (Signed)

## 2022-12-12 MED ORDER — LEVOTHYROXINE SODIUM 25 MCG PO TABS: 25.0000 ug | ORAL_TABLET | Freq: Every day | ORAL | 3 refills | Status: DC

## 2022-12-12 MED ORDER — INSULIN ASPART 100 UNIT/ML IJ SOLN: INTRAMUSCULAR | 3 refills | Status: AC

## 2023-04-10 ENCOUNTER — Other Ambulatory Visit: Payer: Self-pay | Admitting: Family Medicine

## 2023-04-10 ENCOUNTER — Ambulatory Visit
Admission: RE | Admit: 2023-04-10 | Discharge: 2023-04-10 | Disposition: A | Discharge status: Home / Self Care | Payer: BC Managed Care – PPO | Source: Ambulatory Visit | Attending: Family Medicine | Admitting: Family Medicine

## 2023-04-10 DIAGNOSIS — M79672 Pain in left foot: Secondary | ICD-10-CM

## 2023-06-17 ENCOUNTER — Encounter: Payer: Self-pay | Admitting: Internal Medicine

## 2023-06-17 ENCOUNTER — Ambulatory Visit: Payer: BC Managed Care – PPO | Admitting: Internal Medicine

## 2023-06-17 VITALS — BP 114/72 | HR 79 | Ht 68.0 in | Wt 200.6 lb

## 2023-06-17 DIAGNOSIS — Z4681 Encounter for fitting and adjustment of insulin pump: Secondary | ICD-10-CM | POA: Diagnosis not present

## 2023-06-17 DIAGNOSIS — E063 Autoimmune thyroiditis: Secondary | ICD-10-CM | POA: Diagnosis not present

## 2023-06-17 DIAGNOSIS — E1065 Type 1 diabetes mellitus with hyperglycemia: Secondary | ICD-10-CM

## 2023-06-17 LAB — POCT GLYCOSYLATED HEMOGLOBIN (HGB A1C): Hemoglobin A1C: 7 % — AB (ref 4.0–5.6)

## 2023-06-17 NOTE — Progress Notes (Signed)
Name: Stephen Thompson  MRN/ DOB: [id number], October 12, 2001   Age/ Sex: 22 y.o., male    PCP: Blair Heys, MD   Reason for Endocrinology Evaluation: Type 1 Diabetes Mellitus     Date of Initial Endocrinology Visit: 09/04/2021    PATIENT IDENTIFIER: Stephen Thompson is a 22 y.o. male with a past medical history of T1DM, celiac disease, hypothyroid. The patient presented for initial endocrinology clinic visit on 09/04/2021 for consultative assistance with his diabetes management.       DIABETIC HISTORY:  Stephen Thompson was diagnosed with DM at age 62, he was on the Medtronic pump followed by the tandem. His hemoglobin A1c has ranged from 6.8% in 2021, peaking at 11.0% in 2017  On his initial visit to our clinic he had an A1c of 8.3%, we adjusted his pump settings and attempted to start metformin XR but he felt this was a drastic change  and didn't take it   Studies computer science     THYROID HISTORY: He has been noted with an elevated TSH at 7.061 you IU/mL during labs in 2012.  Anti-TPO antibodies negative   No FH of autoimmune disease   CELIAC DISEASE: Pt on gluten free diet    SUBJECTIVE:   During the last visit (12/10/2022): A1c 7.5%    Today (06/17/23): Stephen Thompson is here for follow-up on diabetes management.  Checks his blood sugars multiple times daily, through CGM.Pt with hypoglycemic episodes .  Denies constipation or diarrhea Denies nausea or vomiting  No local neck swelling     This patient with type 1 diabetes is treated with Tandem  (insulin pump). During the visit the pump basal and bolus doses were reviewed including carb/insulin rations and supplemental doses. The clinical list was updated. The glucose meter download was reviewed in detail to determine if the current pump settings are providing the best glycemic control without excessive hypoglycemia.  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   PUMP STATISTICS: Average BG: 164 Average Daily Carbs (g): 58 Average Total Daily Insulin:68.56 Average Daily Basal: 40.68 (59%) Average Daily Bolus: 27.88 (41 %)   CONTINUOUS GLUCOSE MONITORING RECORD INTERPRETATION    Dates of Recording: 5/28-6/24/2024  Sensor description:dexcom  Results statistics:   CGM use % of time 92  Average and SD 164/61  Time in range  65%  % Time Above 180 23  % Time above 250 10  % Time Below target 1   Glycemic patterns summary: Bg's trend down at night and increase during the day   Hyperglycemic episodes  postprandial   Hypoglycemic episodes occurred at night    Overnight periods: trends down    HOME DIABETES REGIMEN: Novolog    Statin: no ACE-I/ARB: no Prior Diabetic Education: yes     DIABETIC COMPLICATIONS: Microvascular complications:   Denies: CKD,  retinopathy, neuropathy  Last eye exam: Completed 2023  Macrovascular complications:   Denies: CAD, PVD, CVA   PAST HISTORY: Past Medical History:  Past Medical History:  Diagnosis Date   Celiac disease 02/24/2012   Recently diagnosed   Diabetes mellitus    Type 1 diabetes mellitus not at goal [facility]) 10/31/2011   Past Surgical History:  Past Surgical History:  Procedure Laterality Date   ESOPHAGOGASTRODUODENOSCOPY  12/21/2011   Procedure: ESOPHAGOGASTRODUODENOSCOPY (EGD);  Surgeon: Jon Gills, MD;  Location: [facility] OR;  Service: Gastroenterology;  Laterality: N/A;   NO PAST SURGERIES      Social History:  reports that he has never smoked. He has never used smokeless tobacco. He reports that he does not drink alcohol and does not use drugs. Family History:   Family History  Problem Relation Age of Onset   Diabetes Paternal Grandfather        type 2   Hyperthyroidism Maternal Grandmother    Thyroid disease Maternal Grandmother    Multiple sclerosis Maternal Grandfather    Diabetes Maternal Grandfather        type 2   Heart failure Cousin    Celiac disease Neg Hx      HOME MEDICATIONS: Allergies as of 06/17/2023       Reactions   Gluten Meal    Wheat    Has celiac         Medication List        Accurate as of June 17, 2023 12:13 PM. If you have any questions, ask your nurse or doctor.          Accu-Chek FastClix Lancets Misc 1 Container by Does not apply route See admin instructions. Check blood sugar 10 times daily Dispense 300 lancets per month.   Accu-Chek Guide test strip Generic drug: glucose blood USE AS INSTRUCTED FOR 6 CHECKS PER DAY PLUS PER PROTOCOL FOR HYPER/HYPOGLYCEMIA   Dexcom G6 Sensor Misc Inject 1 applicator into the skin as directed. Change sensor every 30 days.   Dexcom G6 Transmitter Misc Inject 1 kit into the skin as directed. Reuse transmitter 8 times.   GlucaGen HypoKit 1 MG Solr injection Generic drug: glucagon GLUCAGEN HYPOKIT DOUBLE PACK. Inject 1 mg into anterior thigh muscle if unconscious, unable to swallow, unresponsive and/or has a seizure   Glucagon Emergency 1 MG Kit   insulin aspart 100 UNIT/ML injection Commonly known as: NovoLOG USE 300 UNITS IN INSULIN PUMP EVERY 48 HOURS AS DIRECTED   Insulin Pen Needle 32G X 4 MM Misc Inject 1 each into the skin as needed. Reported on 05/08/2016   levothyroxine 25 MCG tablet Commonly known as: SYNTHROID Take 1 tablet (25 mcg total) by mouth daily before breakfast.         ALLERGIES: Allergies  Allergen Reactions   Gluten Meal    Wheat     Has celiac         OBJECTIVE:   VITAL SIGNS: BP 114/72 (BP Location: Left Arm, Patient Position: Sitting, Cuff Size: Large)   Pulse 79   Ht 5\' 8"  (1.727 m)   Wt 200 lb 9.6 oz (91  kg)   SpO2 98%   BMI 30.50 kg/m    PHYSICAL EXAM:  General: Pt appears well and is in NAD  Neck: General: Supple without adenopathy or carotid bruits. Thyroid: Thyroid size normal.  No goiter or nodules appreciated.   Lungs: Clear with good BS bilat   Heart: RRR   Abdomen: soft, nontender  Extremities:  Lower extremities - No pretibial edema.  Neuro: MS is good with appropriate affect, pt is alert and Ox3    DM foot exam:06/17/2023  The skin of the feet is intact without sores or ulcerations. The pedal pulses are 2+ on right and 2+ on left. The sensation is intact to a screening 5.07, 10 gram monofilament bilaterally   DATA REVIEWED:  Lab Results  Component Value Date   HGBA1C 7.0 (A) 06/17/2023   HGBA1C 7.5 (A) 12/10/2022   HGBA1C 7.3 (A) 06/11/2022     Latest Reference Range & Units 12/10/22 13:30  Sodium 135 - 145 mEq/L 138  Potassium 3.5 - 5.1 mEq/L 4.1  Chloride 96 - 112 mEq/L 103  CO2 19 - 32 mEq/L 26  Glucose 70 - 99 mg/dL 657 (H)  BUN 6 - 23 mg/dL 15  Creatinine 8.46 - 9.62 mg/dL 9.52  Calcium 8.4 - 84.1 mg/dL 9.9  GFR >32.44 mL/min 111.91  Total CHOL/HDL Ratio  4  Cholesterol 0 - 200 mg/dL 010  HDL Cholesterol >27.25 mg/dL 36.64  LDL (calc) 0 - 99 mg/dL 403 (H)  MICROALB/CREAT RATIO 0.0 - 30.0 mg/g 0.8  NonHDL  144.24  Triglycerides 0.0 - 149.0 mg/dL 474.2  VLDL 0.0 - 59.5 mg/dL 63.8    Latest Reference Range & Units 12/10/22 13:30  TSH 0.35 - 5.50 uIU/mL 2.05    Latest Reference Range & Units 12/10/22 13:30  Creatinine,U mg/dL 75.6  Microalb, Ur 0.0 - 1.9 mg/dL <4.3  MICROALB/CREAT RATIO 0.0 - 30.0 mg/g 0.8     ASSESSMENT / PLAN / RECOMMENDATIONS:   1) Type 1 Diabetes Mellitus, Optimally controlled, Without complications - Most recent A1c of 7.0  %. Goal A1c < 7.0 %.     - A1c at goal  - Praised pt on weight loss  - He had declined Add-on therapy in the pt to improve insulin resistant -Patient has been noted with hypoglycemia/tight BG's  overnight, will reduce basal rate at 2 AM -He has been noted with postprandial hyperglycemia, but is comfortable with the current insulin to carb ratio -He continues to use Dexcom G6, he will upgrade to Dexcom G7 once his new insurance kicks in   MEDICATIONS: Novolog       Basal rate   I:C ratio  SF   0000 1.8  u/hr 7 25   0200 1.6 7 25    0500 1.4 7 30    0800 1.40 7 25   1200 1.8 7 20    1800 2.1 7 20                  EDUCATION / INSTRUCTIONS: BG monitoring instructions: Patient is instructed to check his blood sugars 3 times a day, before meals . Call [facility] if: BG persistently < 70  I reviewed the Rule of 15 for the treatment of hypoglycemia in detail with the patient. Literature supplied.   2) Diabetic complications:  Eye: Does not have known diabetic retinopathy.  Neuro/ Feet: Does not have known diabetic peripheral neuropathy. Renal: Patient does not have known baseline CKD. He is not on an ACEI/ARB at present.     3) Hypothyroidism :  -Patient is clinically euthyroid -He continues with imperfect adherence to levothyroxine -Patient realized he forgot a week at some point, but he was not comfortable taking a full week dose, I did explain to the patient that he may take 7 tablets of levothyroxine if he has forgotten to take a weeks worth, but it is best if he starts using a pillbox -  No changes  Medication  Continue Levothyroxine 25 mcg daily    4) Dyslipidemia :   - LDL slightly elevated in the past - No indication for statin therapy until age 72    F/U in 6 months   Signed electronically by: Lyndle Herrlich, MD  [facility]  [facility] [address] [place], [place] [address] Phone: [phone] FAX: [phone]   CC: Blair Heys, MD [address] [place] [address] Phone: [phone]  Fax: [phone]    Return to Endocrinology clinic as below: No  future appointments.

## 2023-06-17 NOTE — Patient Instructions (Signed)

## 2023-06-28 ENCOUNTER — Encounter (INDEPENDENT_AMBULATORY_CARE_PROVIDER_SITE_OTHER): Payer: Self-pay

## 2023-08-22 ENCOUNTER — Encounter: Payer: Self-pay | Admitting: Nurse Practitioner

## 2023-11-05 ENCOUNTER — Ambulatory Visit (INDEPENDENT_AMBULATORY_CARE_PROVIDER_SITE_OTHER): Payer: Managed Care, Other (non HMO) | Admitting: Nurse Practitioner

## 2023-11-05 ENCOUNTER — Encounter: Payer: Self-pay | Admitting: Nurse Practitioner

## 2023-11-05 ENCOUNTER — Other Ambulatory Visit (INDEPENDENT_AMBULATORY_CARE_PROVIDER_SITE_OTHER): Payer: Managed Care, Other (non HMO)

## 2023-11-05 VITALS — BP 100/60 | HR 87 | Ht 71.0 in | Wt 198.8 lb

## 2023-11-05 DIAGNOSIS — K605 Anorectal fistula, unspecified: Secondary | ICD-10-CM | POA: Diagnosis not present

## 2023-11-05 DIAGNOSIS — E109 Type 1 diabetes mellitus without complications: Secondary | ICD-10-CM

## 2023-11-05 LAB — COMPREHENSIVE METABOLIC PANEL
ALT: 17 U/L (ref 0–53)
AST: 20 U/L (ref 0–37)
Albumin: 4.8 g/dL (ref 3.5–5.2)
Alkaline Phosphatase: 115 U/L (ref 39–117)
BUN: 11 mg/dL (ref 6–23)
CO2: 24 meq/L (ref 19–32)
Calcium: 9.9 mg/dL (ref 8.4–10.5)
Chloride: 104 meq/L (ref 96–112)
Creatinine, Ser: 0.94 mg/dL (ref 0.40–1.50)
GFR: 114.05 mL/min (ref >60.00)
Glucose, Bld: 236 mg/dL — ABNORMAL HIGH (ref 70–99)
Potassium: 4 meq/L (ref 3.5–5.1)
Sodium: 140 meq/L (ref 135–145)
Total Bilirubin: 0.4 mg/dL (ref 0.2–1.2)
Total Protein: 7.9 g/dL (ref 6.0–8.3)

## 2023-11-05 LAB — SEDIMENTATION RATE: Sed Rate: 27 mm/h — ABNORMAL HIGH (ref 0–15)

## 2023-11-05 LAB — TSH: TSH: 2.38 u[IU]/mL (ref 0.35–5.50)

## 2023-11-05 MED ORDER — CLENPIQ 10-3.5-12 MG-GM -GM/160ML PO SOLN: 1.0000 | ORAL | Status: DC

## 2023-11-05 NOTE — Patient Instructions (Addendum)
You have been scheduled for a colonoscopy. Please follow written instructions given to you at your visit today.   Please pick up your prep supplies at the pharmacy within the next 1-3 days.  If you use inhalers (even only as needed), please bring them with you on the day of your procedure.  DO NOT TAKE 7 DAYS PRIOR TO TEST- Trulicity (dulaglutide) Ozempic, Wegovy (semaglutide) Mounjaro (tirzepatide) Bydureon Bcise (exanatide extended release)  DO NOT TAKE 1 DAY PRIOR TO YOUR TEST Rybelsus (semaglutide) Adlyxin (lixisenatide) Victoza (liraglutide) Byetta (exanatide) ___________________________________________________________________________ Your provider has requested that you go to the basement level for lab work before leaving today. Press "B" on the elevator. The lab is located at the first door on the left as you exit the elevator.  Contact your endocrinologist to verify insulin pump instructions the day before your colonoscopy and the day of the colonoscopy.  50% of clear liquid diet should contain sugar.  Due to recent changes in healthcare laws, you may see the results of your imaging and laboratory studies on MyChart before your provider has had a chance to review them.  We understand that in some cases there may be results that are confusing or concerning to you. Not all laboratory results come back in the same time frame and the provider may be waiting for multiple results in order to interpret others.  Please give [place] 48 hours in order for your provider to thoroughly review all the results before contacting the office for clarification of your results.   Thank you for trusting me with your gastrointestinal care!   Alcide Evener, CRNP

## 2023-11-05 NOTE — Progress Notes (Signed)
11/05/2023 HENNESSY ADA [id number] 04-19-2001   CHIEF COMPLAINT: Possible Crohn's disease, schedule a colonoscopy   HISTORY OF PRESENT ILLNESS: Janyth Pupa B. Kucher is a 22 year old male with a past medical history significant for diabetes mellitus type 1 on insulin pump, celiac disease and Hashimoto's thyroiditis all diagnosed at the age of 44.  He presents her office today as referred by Dr. Marin Olp to schedule a colonoscopy for suspected Crohn's disease.  He developed anorectal pain approximately 1-1/2 years ago, initially thought it was hemorrhoids.  He was subsequently diagnosed with an anal rectal abscess x 2 occurrences treated with antibiotics and drained spontaneously without requiring I&D. He was evaluated by Dr. 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 Further examination under anesthesia was discussed, however, the patient did not wish to pursue any evaluation under anesthesia at that time.  He was seen by Dr. Cliffton Asters for follow-up 10/16/2023 and anal rectal exam showed the prior punctate opening in the left anterior anal margin closed and there was no evidence of anal rectal abscess.  He was advised to proceed with a GI consult and to schedule a colonoscopy. He denies having any abdominal or anal rectal pain at this time.  He last passed purulent drainage from the anal rectal area 1 month ago and last passed blood per the rectum several months ago. He is passing normal formed brown bowel movement most days.  He has infrequent constipation for which he takes MiraLAX as needed.  No nausea or vomiting.  He has had oral aphthous ulcers in the past, not recently. He was diagnosed with celiac disease, reportedly confirmed by EGD and duodenal biopsies at the age of 35. He denies ever having a colonoscopy.  No known family history of inflammatory bowel disease.     Latest Ref Rng & Units 10/08/2019    8:22  AM 05/27/2019   12:00 AM 07/07/2018   12:00 AM  CBC  WBC 3.8 - 10.8 Thousand/uL 14.1  6.1  6.4   Hemoglobin 13.2 - 17.1 g/dL 16.6  06.3  01.6   Hematocrit 38.5 - 50.0 % 40.4  45.6  47.7   Platelets 140 - 400 Thousand/uL 197  257  268      Past Medical History:  Diagnosis Date   Celiac disease 02/24/2012   Recently diagnosed   Diabetes mellitus    Hashimoto's disease    Type 1 diabetes mellitus not at goal [facility]) 10/31/2011   Past Surgical History:  Procedure Laterality Date   ESOPHAGOGASTRODUODENOSCOPY  12/21/2011   Procedure: ESOPHAGOGASTRODUODENOSCOPY (EGD);  Surgeon: Jon Gills, MD;  Location: [facility] OR;  Service: Gastroenterology;  Laterality: N/A;   NO PAST SURGERIES     Social History: He is single.  Non-smoker.  No alcohol use.  No drug use.  Family History: family history includes Diabetes in his maternal grandfather and paternal grandfather; Heart failure in his cousin; Hyperthyroidism in his maternal grandmother; Multiple sclerosis in his maternal grandfather; Thyroid disease in his maternal grandmother.  No known family history of celiac disease, IBD or colorectal cancer.   Allergies  Allergen Reactions   Gluten Meal    Wheat     Has celiac      Outpatient Encounter Medications as of 11/05/2023  Medication Sig   ACCU-CHEK FASTCLIX LANCETS MISC 1 Container by Does not apply route See admin instructions. Check blood sugar 10 times daily Dispense 300 lancets per  month.   ACCU-CHEK GUIDE test strip USE AS INSTRUCTED FOR 6 CHECKS PER DAY PLUS PER PROTOCOL FOR HYPER/HYPOGLYCEMIA   Continuous Blood Gluc Sensor (DEXCOM G6 SENSOR) MISC Inject 1 applicator into the skin as directed. Change sensor every 30 days.   Continuous Blood Gluc Transmit (DEXCOM G6 TRANSMITTER) MISC Inject 1 kit into the skin as directed. Reuse transmitter 8 times.   glucagon (GLUCAGEN HYPOKIT) 1 MG SOLR injection GLUCAGEN HYPOKIT DOUBLE PACK. Inject 1 mg into anterior thigh muscle if unconscious,  unable to swallow, unresponsive and/or has a seizure   Glucagon, rDNA, (GLUCAGON EMERGENCY) 1 MG KIT    insulin aspart (NOVOLOG) 100 UNIT/ML injection USE 300 UNITS IN INSULIN PUMP EVERY 48 HOURS AS DIRECTED   Insulin Pen Needle 32G X 4 MM MISC Inject 1 each into the skin as needed. Reported on 05/08/2016   levothyroxine (SYNTHROID) 25 MCG tablet Take 1 tablet (25 mcg total) by mouth daily before breakfast.   No facility-administered encounter medications on file as of 11/05/2023.   REVIEW OF SYSTEMS:  Gen: Denies fever, sweats or chills. No weight loss.  CV: Denies chest pain, palpitations or edema. Resp: Denies cough, shortness of breath of hemoptysis.  GI: See HPI.  GU: Denies urinary burning, blood in urine, increased urinary frequency or incontinence. MS: Denies joint pain, muscles aches or weakness. Derm: Denies rash, itchiness, skin lesions or unhealing ulcers. Psych: Denies depression, anxiety, memory loss or confusion. Heme: Denies bruising, easy bleeding. Neuro:  Denies headaches, dizziness or paresthesias. Endo:  + DM type I, Hashimoto thyroiditis.  PHYSICAL EXAM: BP 100/60   Pulse 87   Ht 5\' 11"  (1.803 m)   Wt 198 lb 12.8 oz (90.2 kg)   BMI 27.73 kg/m  General: 22 year old male in no acute distress. Head: Normocephalic and atraumatic. Eyes:  Sclerae non-icteric, conjunctive pink. Ears: Normal auditory acuity. Mouth: Dentition intact. No aphthous ulcers or lesions.  Neck: Supple, no lymphadenopathy or thyromegaly.  Lungs: Clear bilaterally to auscultation without wheezes, crackles or rhonchi. Heart: Regular rate and rhythm. No murmur, rub or gallop appreciated.  Abdomen: Soft, nontender, nondistended. No masses. No hepatosplenomegaly. Normoactive bowel sounds x 4 quadrants.  Rectal: Deferred, recent exam by colorectal surgeon negative for fissure, fistula opening reported as closed. See HPI.  Patient did not wish to undergo repeat rectal exam at this time and will  proceed with a  future colonoscopy for further evaluation. Musculoskeletal: Symmetrical with no gross deformities. Skin: Warm and dry. No rash or lesions on visible extremities. Extremities: No edema. Neurological: Alert oriented x 4, no focal deficits.  Psychological:  Alert and cooperative. Normal mood and affect.  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 Anoscopy exam by colorectal surgeon showed evidence of an left anterior anal fissure which closed per follow-up exam 10/16/2023.  -Diagnostic colonoscopy to rule out Crohn's disease benefits and risks discussed including risk with sedation, risk of bleeding, perforation and infection  -CBC, CMP, sed rate and TSH -MiraLAX nightly as needed -Patient to contact our office as well as Dr. Cristal Deer White's office if anal rectal swelling/fistula drainage recurs -Patient to contact his endocrinologist to verify insulin pump instructions day of prep and day before colonoscopy. Patient was informed day before colonoscopy 50% of clear liquids day should contain sugar -Further recommendations to be determined after colonoscopy completed  DM type I on an insulin pump -See plan above   Celiac disease -Continue gluten-free diet  CC:  Blair Heys, MD

## 2023-11-06 LAB — CBC WITH DIFFERENTIAL/PLATELET
Basophils Absolute: 0 10*3/uL (ref 0.0–0.1)
Basophils Relative: 0.6 % (ref 0.0–3.0)
Eosinophils Absolute: 0.1 10*3/uL (ref 0.0–0.7)
Eosinophils Relative: 0.9 % (ref 0.0–5.0)
HCT: 46.4 % (ref 39.0–52.0)
Hemoglobin: 15.5 g/dL (ref 13.0–17.0)
Lymphocytes Relative: 26.1 % (ref 12.0–46.0)
Lymphs Abs: 2.2 10*3/uL (ref 0.7–4.0)
MCHC: 33.4 g/dL (ref 30.0–36.0)
MCV: 87 fL (ref 78.0–100.0)
Monocytes Absolute: 0.5 10*3/uL (ref 0.1–1.0)
Monocytes Relative: 5.5 % (ref 3.0–12.0)
Neutro Abs: 5.5 10*3/uL (ref 1.4–7.7)
Neutrophils Relative %: 66.9 % (ref 43.0–77.0)
Platelets: 262 10*3/uL (ref 150.0–400.0)
RBC: 5.33 Mil/uL (ref 4.22–5.81)
RDW: 12.9 % (ref 11.5–15.5)
WBC: 8.3 10*3/uL (ref 4.0–10.5)

## 2023-11-07 ENCOUNTER — Telehealth: Payer: Self-pay | Admitting: Internal Medicine

## 2023-11-07 NOTE — Telephone Encounter (Signed)
The patient is scheduled to have colonoscopy in January 2025    Please asked the patient to change his basal rate the night prior to the procedure as below   Midnight = 1.6 2 AM = 1.4 5 AM = 1.2 8 AM = 1.2 Noon = 1.6 6 PM = 1.9    Thanks

## 2023-11-07 NOTE — Telephone Encounter (Signed)
Patient aware of basal rate changes prior to the procedure.

## 2023-11-09 NOTE — Progress Notes (Signed)
Agree with assessment/plan.  Raj Florestine Carmical, MD [facility] [phone]  

## 2024-01-01 ENCOUNTER — Ambulatory Visit: Payer: BC Managed Care – PPO | Admitting: Internal Medicine

## 2024-01-15 ENCOUNTER — Encounter: Payer: Self-pay | Admitting: Gastroenterology

## 2024-01-23 ENCOUNTER — Ambulatory Visit (AMBULATORY_SURGERY_CENTER): Payer: Managed Care, Other (non HMO) | Admitting: Gastroenterology

## 2024-01-23 ENCOUNTER — Encounter: Payer: Self-pay | Admitting: Gastroenterology

## 2024-01-23 VITALS — BP 125/61 | HR 72 | Temp 99.1°F | Resp 16 | Ht 71.0 in | Wt 198.0 lb

## 2024-01-23 DIAGNOSIS — K605 Anorectal fistula, unspecified: Secondary | ICD-10-CM

## 2024-01-23 DIAGNOSIS — K640 First degree hemorrhoids: Secondary | ICD-10-CM

## 2024-01-23 DIAGNOSIS — K6289 Other specified diseases of anus and rectum: Secondary | ICD-10-CM | POA: Diagnosis not present

## 2024-01-23 HISTORY — PX: COLONOSCOPY: SHX174

## 2024-01-23 MED ORDER — SODIUM CHLORIDE 0.9 % IV SOLN
500.0000 mL | Freq: Once | INTRAVENOUS | Status: DC
Start: 2024-01-23 — End: 2024-01-23

## 2024-01-23 NOTE — Progress Notes (Signed)
Report to PACU, RN, vss, BBS= Clear.

## 2024-01-23 NOTE — Patient Instructions (Signed)
YOU HAD AN ENDOSCOPIC PROCEDURE TODAY AT THE [facility]:   Refer to the procedure report that was given to you for any specific questions about what was found during the examination.  If the procedure report does not answer your questions, please call your gastroenterologist to clarify.  If you requested that your care partner not be given the details of your procedure findings, then the procedure report has been included in a sealed envelope for you to review at your convenience later.  YOU SHOULD EXPECT: Some feelings of bloating in the abdomen. Passage of more gas than usual.  Walking can help get rid of the air that was put into your GI tract during the procedure and reduce the bloating. If you had a lower endoscopy (such as a colonoscopy or flexible sigmoidoscopy) you may notice spotting of blood in your stool or on the toilet paper. If you underwent a bowel prep for your procedure, you may not have a normal bowel movement for a few days.  Please Note:  You might notice some irritation and congestion in your nose or some drainage.  This is from the oxygen used during your procedure.  There is no need for concern and it should clear up in a day or so.  SYMPTOMS TO REPORT IMMEDIATELY:  Following lower endoscopy (colonoscopy or flexible sigmoidoscopy):  Excessive amounts of blood in the stool  Significant tenderness or worsening of abdominal pains  Swelling of the abdomen that is new, acute  Fever of 100F or higher  For urgent or emergent issues, a gastroenterologist can be reached at any hour by calling [phone]. Do not use MyChart messaging for urgent concerns.    DIET:  We do recommend a small meal at first, but then you may proceed to your regular diet.  Drink plenty of fluids but you should avoid alcoholic beverages for 24 hours.  ACTIVITY:  You should plan to take it easy for the rest of today and you should NOT DRIVE or use heavy machinery until tomorrow (because of  the sedation medicines used during the test).    FOLLOW UP: Our staff will call the number listed on your records the next business day following your procedure.  We will call around 7:15- 8:00 am to check on you and address any questions or concerns that you may have regarding the information given to you following your procedure. If we do not reach you, we will leave a message.     If any biopsies were taken you will be contacted by phone or by letter within the next 1-3 weeks.  Please call us at [phone] if you have not heard about the biopsies in 3 weeks.    SIGNATURES/CONFIDENTIALITY: You and/or your care partner have signed paperwork which will be entered into your electronic medical record.  These signatures attest to the fact that that the information above on your After Visit Summary has been reviewed and is understood.  Full responsibility of the confidentiality of this discharge information lies with you and/or your care-partner.

## 2024-01-23 NOTE — Op Note (Signed)
[facility] Patient Name: Stephen Thompson Procedure Date: 01/23/2024 9:01 AM MRN: [id number] Endoscopist: Lynann Bologna , MD, [id number] Age: 23 Referring MD:  Date of Birth: 04-Jan-2001 Gender: Male Account #: [id number] Procedure:                Colonoscopy Indications:              H/O Anorectal fistula-colonoscopy is being                            performed to rule out Crohn's disease. History of                            celiac disease. Medicines:                Monitored Anesthesia Care Procedure:                Pre-Anesthesia Assessment:                           - Prior to the procedure, a History and Physical                            was performed, and patient medications and                            allergies were reviewed. The patient's tolerance of                            previous anesthesia was also reviewed. The risks                            and benefits of the procedure and the sedation                            options and risks were discussed with the patient.                            All questions were answered, and informed consent                            was obtained. Prior Anticoagulants: The patient has                            taken no anticoagulant or antiplatelet agents. ASA                            Grade Assessment: II - A patient with mild systemic                            disease. After reviewing the risks and benefits,                            the patient was deemed in satisfactory condition to  undergo the procedure.                           After obtaining informed consent, the colonoscope                            was passed under direct vision. Throughout the                            procedure, the patient's blood pressure, pulse, and                            oxygen saturations were monitored continuously. The                            CF [id number] #[id number] was introduced through the anus                             and advanced to the 4 cm into the ileum. The                            colonoscopy was performed without difficulty. The                            patient tolerated the procedure well. The quality                            of the bowel preparation was good. The terminal                            ileum, ileocecal valve, appendiceal orifice, and                            rectum were photographed. Scope In: 9:06:12 AM Scope Out: 9:21:00 AM Scope Withdrawal Time: 0 hours 11 minutes 45 seconds  Total Procedure Duration: 0 hours 14 minutes 48 seconds  Findings:                 The perianal and digital rectal examinations were                            normal. His fistula has completely healed.                           The colon (entire examined portion) appeared normal                            with well-preserved vascular pattern. Biopsies were                            taken with a cold forceps for histology (from colon                            and separate biopsies were obtained from the  rectum).                           The terminal ileum appeared normal. Biopsies were                            taken with a cold forceps for histology.                           Non-bleeding internal hemorrhoids were found during                            retroflexion. The hemorrhoids were small and Grade                            I (internal hemorrhoids that do not prolapse).                           The exam was otherwise without abnormality on                            direct and retroflexion views. Complications:            No immediate complications. Estimated Blood Loss:     Estimated blood loss: none. Impression:               - Non-bleeding internal hemorrhoids.                           - Otherwise normal colonoscopy to TI. No endoscopic                            evidence of Crohn's disease. Recommendation:           - Patient  has a contact number available for                            emergencies. The signs and symptoms of potential                            delayed complications were discussed with the                            patient. Return to normal activities tomorrow.                            Written discharge instructions were provided to the                            patient.                           - Resume previous diet.                           - Continue present medications.                           -  Await pathology results.                           - Repeat colonoscopy at age 49 as per current                            recommendations for CRC screening., Earlier if with                            any new problems or change in family history                           - The findings and recommendations were discussed                            with the designated responsible adult. Lynann Bologna, MD 01/23/2024 9:28:29 AM This report has been signed electronically.

## 2024-01-23 NOTE — Progress Notes (Signed)
11/05/2023 Stephen Thompson [id number] 08-14-2001     CHIEF COMPLAINT: Possible Crohn's disease, schedule a colonoscopy    HISTORY OF PRESENT ILLNESS: Stephen Thompson is a 23 year old male with a past medical history significant for diabetes mellitus type 1 on insulin pump, celiac disease and Hashimoto's thyroiditis all diagnosed at the age of 72.  He presents her office today as referred by Dr. Marin Olp to schedule a colonoscopy for suspected Crohn's disease.  He developed anorectal pain approximately 1-1/2 years ago, initially thought it was hemorrhoids.  He was subsequently diagnosed with an anal rectal abscess x 2 occurrences treated with antibiotics and drained spontaneously without requiring I&D. He was evaluated by Dr. 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 Further examination under anesthesia was discussed, however, the patient did not wish to pursue any evaluation under anesthesia at that time.  He was seen by Dr. Cliffton Asters for follow-up 10/16/2023 and anal rectal exam showed the prior punctate opening in the left anterior anal margin closed and there was no evidence of anal rectal abscess.  He was advised to proceed with a GI consult and to schedule a colonoscopy. He denies having any abdominal or anal rectal pain at this time.  He last passed purulent drainage from the anal rectal area 1 month ago and last passed blood per the rectum several months ago. He is passing normal formed brown bowel movement most days.  He has infrequent constipation for which he takes MiraLAX as needed.  No nausea or vomiting.  He has had oral aphthous ulcers in the past, not recently. He was diagnosed with celiac disease, reportedly confirmed by EGD and duodenal biopsies at the age of 26. He denies ever having a colonoscopy.  No known family history of inflammatory bowel disease.       Latest Ref Rng & Units 10/08/2019     8:22 AM 05/27/2019   12:00 AM 07/07/2018   12:00 AM  CBC  WBC 3.8 - 10.8 Thousand/uL 14.1  6.1  6.4   Hemoglobin 13.2 - 17.1 g/dL 40.9  81.1  91.4   Hematocrit 38.5 - 50.0 % 40.4  45.6  47.7   Platelets 140 - 400 Thousand/uL 197  257  268           Past Medical History:  Diagnosis Date   Celiac disease 02/24/2012    Recently diagnosed   Diabetes mellitus     Hashimoto's disease     Type 1 diabetes mellitus not at goal [facility]) 10/31/2011             Past Surgical History:  Procedure Laterality Date   ESOPHAGOGASTRODUODENOSCOPY   12/21/2011    Procedure: ESOPHAGOGASTRODUODENOSCOPY (EGD);  Surgeon: Jon Gills, MD;  Location: [facility] OR;  Service: Gastroenterology;  Laterality: N/A;   NO PAST SURGERIES            Social History: He is single.  Non-smoker.  No alcohol use.  No drug use.   Family History: family history includes Diabetes in his maternal grandfather and paternal grandfather; Heart failure in his cousin; Hyperthyroidism in his maternal grandmother; Multiple sclerosis in his maternal grandfather; Thyroid disease in his maternal grandmother.  No known family history of celiac disease, IBD or colorectal cancer.     Allergies       Allergies  Allergen Reactions   Gluten Meal     Wheat  Has celiac             Outpatient Encounter Medications as of 11/05/2023  Medication Sig   ACCU-CHEK FASTCLIX LANCETS MISC 1 Container by Does not apply route See admin instructions. Check blood sugar 10 times daily Dispense 300 lancets per month.   ACCU-CHEK GUIDE test strip USE AS INSTRUCTED FOR 6 CHECKS PER DAY PLUS PER PROTOCOL FOR HYPER/HYPOGLYCEMIA   Continuous Blood Gluc Sensor (DEXCOM G6 SENSOR) MISC Inject 1 applicator into the skin as directed. Change sensor every 30 days.   Continuous Blood Gluc Transmit (DEXCOM G6 TRANSMITTER) MISC Inject 1 kit into the skin as directed. Reuse transmitter 8 times.   glucagon (GLUCAGEN HYPOKIT) 1 MG SOLR injection GLUCAGEN HYPOKIT  DOUBLE PACK. Inject 1 mg into anterior thigh muscle if unconscious, unable to swallow, unresponsive and/or has a seizure   Glucagon, rDNA, (GLUCAGON EMERGENCY) 1 MG KIT     insulin aspart (NOVOLOG) 100 UNIT/ML injection USE 300 UNITS IN INSULIN PUMP EVERY 48 HOURS AS DIRECTED   Insulin Pen Needle 32G X 4 MM MISC Inject 1 each into the skin as needed. Reported on 05/08/2016   levothyroxine (SYNTHROID) 25 MCG tablet Take 1 tablet (25 mcg total) by mouth daily before breakfast.      No facility-administered encounter medications on file as of 11/05/2023.      REVIEW OF SYSTEMS:  Gen: Denies fever, sweats or chills. No weight loss.  CV: Denies chest pain, palpitations or edema. Resp: Denies cough, shortness of breath of hemoptysis.  GI: See HPI.  GU: Denies urinary burning, blood in urine, increased urinary frequency or incontinence. MS: Denies joint pain, muscles aches or weakness. Derm: Denies rash, itchiness, skin lesions or unhealing ulcers. Psych: Denies depression, anxiety, memory loss or confusion. Heme: Denies bruising, easy bleeding. Neuro:  Denies headaches, dizziness or paresthesias. Endo:  + DM type I, Hashimoto thyroiditis.   PHYSICAL EXAM: BP 100/60   Pulse 87   Ht 5\' 11"  (1.803 m)   Wt 198 lb 12.8 oz (90.2 kg)   BMI 27.73 kg/m  General: 23 year old male in no acute distress. Head: Normocephalic and atraumatic. Eyes:  Sclerae non-icteric, conjunctive pink. Ears: Normal auditory acuity. Mouth: Dentition intact. No aphthous ulcers or lesions.  Neck: Supple, no lymphadenopathy or thyromegaly.  Lungs: Clear bilaterally to auscultation without wheezes, crackles or rhonchi. Heart: Regular rate and rhythm. No murmur, rub or gallop appreciated.  Abdomen: Soft, nontender, nondistended. No masses. No hepatosplenomegaly. Normoactive bowel sounds x 4 quadrants.  Rectal: Deferred, recent exam by colorectal surgeon negative for fissure, fistula opening reported as closed. See HPI.   Patient did not wish to undergo repeat rectal exam at this time and will proceed with a  future colonoscopy for further evaluation. Musculoskeletal: Symmetrical with no gross deformities. Skin: Warm and dry. No rash or lesions on visible extremities. Extremities: No edema. Neurological: Alert oriented x 4, no focal deficits.  Psychological:  Alert and cooperative. Normal mood and affect.   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 Anoscopy exam by colorectal surgeon showed evidence of an left anterior anal fissure which closed per follow-up exam 10/16/2023.  -Diagnostic colonoscopy to rule out Crohn's disease benefits and risks discussed including risk with sedation, risk of bleeding, perforation and infection  -CBC, CMP, sed rate and TSH -MiraLAX nightly as needed -Patient to contact our office as well as Dr. Cristal Deer White's office if anal rectal swelling/fistula  drainage recurs -Patient to contact his endocrinologist to verify insulin pump instructions day of prep and day before colonoscopy. Patient was informed day before colonoscopy 50% of clear liquids day should contain sugar -Further recommendations to be determined after colonoscopy completed   DM type I on an insulin pump -See plan above    Celiac disease -Continue gluten-free diet     Attending physician's note   I have taken history, reviewed the chart and examined the patient. I performed a substantive portion of this encounter, including complete performance of at least one of the key components, in conjunction with the APP. I agree with the Advanced Practitioner's note, impression and recommendations.    Edman Circle, MD [facility] [phone]

## 2024-01-23 NOTE — Progress Notes (Signed)
Called to room to assist during endoscopic procedure.  Patient ID and intended procedure confirmed with present staff. Received instructions for my participation in the procedure from the performing physician.

## 2024-01-24 ENCOUNTER — Telehealth: Payer: Self-pay

## 2024-01-24 NOTE — Telephone Encounter (Signed)
Follow up call to pt; unable to leave vm

## 2024-01-28 LAB — SURGICAL PATHOLOGY

## 2024-02-06 ENCOUNTER — Encounter: Payer: Self-pay | Admitting: Gastroenterology

## 2024-03-03 ENCOUNTER — Other Ambulatory Visit: Payer: Self-pay | Admitting: Internal Medicine

## 2024-05-21 ENCOUNTER — Other Ambulatory Visit: Payer: Self-pay | Admitting: Surgery

## 2024-05-21 DIAGNOSIS — K603 Anal fistula, unspecified: Secondary | ICD-10-CM

## 2024-06-02 ENCOUNTER — Ambulatory Visit
Admission: RE | Admit: 2024-06-02 | Discharge: 2024-06-02 | Disposition: A | Discharge status: Home / Self Care | Source: Ambulatory Visit | Attending: Surgery | Admitting: Surgery

## 2024-06-02 DIAGNOSIS — K603 Anal fistula, unspecified: Secondary | ICD-10-CM

## 2024-06-02 MED ORDER — GADOPICLENOL 0.5 MMOL/ML IV SOLN
9.0000 mL | Freq: Once | INTRAVENOUS | Status: AC | PRN
  Administered 2024-06-02: 9 mL via INTRAVENOUS
